# Patient Record
Sex: Female | Born: 1975 | Race: Black or African American | Hispanic: No | Marital: Single | Smoking: Current every day smoker
Health system: Southern US, Community
[De-identification: ages and names within clinical notes are randomized; demographics above are authoritative.]

## PROBLEM LIST (undated history)

## (undated) ENCOUNTER — Emergency Department (HOSPITAL_COMMUNITY): Admission: EM | Payer: Self-pay | Source: Home / Self Care

## (undated) DIAGNOSIS — N760 Acute vaginitis: Secondary | ICD-10-CM

## (undated) DIAGNOSIS — K219 Gastro-esophageal reflux disease without esophagitis: Secondary | ICD-10-CM

## (undated) DIAGNOSIS — B9689 Other specified bacterial agents as the cause of diseases classified elsewhere: Secondary | ICD-10-CM

## (undated) HISTORY — PX: DILATION AND CURETTAGE OF UTERUS: SHX78

## (undated) HISTORY — PX: TUBAL LIGATION: SHX77

---

## 1998-07-31 ENCOUNTER — Emergency Department (HOSPITAL_COMMUNITY): Admission: EM | Admit: 1998-07-31 | Discharge: 1998-07-31 | Payer: Self-pay | Admitting: Emergency Medicine

## 2000-03-07 ENCOUNTER — Other Ambulatory Visit: Admission: RE | Admit: 2000-03-07 | Discharge: 2000-03-07 | Payer: Self-pay | Admitting: Obstetrics

## 2000-09-06 ENCOUNTER — Inpatient Hospital Stay (HOSPITAL_COMMUNITY): Admission: AD | Admit: 2000-09-06 | Discharge: 2000-09-10 | Payer: Self-pay | Admitting: *Deleted

## 2000-09-06 ENCOUNTER — Encounter (INDEPENDENT_AMBULATORY_CARE_PROVIDER_SITE_OTHER): Payer: Self-pay | Admitting: Specialist

## 2000-09-06 ENCOUNTER — Encounter: Payer: Self-pay | Admitting: Obstetrics

## 2002-08-14 ENCOUNTER — Inpatient Hospital Stay (HOSPITAL_COMMUNITY): Admission: AD | Admit: 2002-08-14 | Discharge: 2002-08-17 | Payer: Self-pay | Admitting: Obstetrics

## 2002-08-14 ENCOUNTER — Encounter (INDEPENDENT_AMBULATORY_CARE_PROVIDER_SITE_OTHER): Payer: Self-pay | Admitting: Specialist

## 2003-10-28 ENCOUNTER — Ambulatory Visit (HOSPITAL_COMMUNITY): Admission: RE | Admit: 2003-10-28 | Discharge: 2003-10-28 | Payer: Self-pay | Admitting: Obstetrics

## 2003-11-20 ENCOUNTER — Ambulatory Visit (HOSPITAL_COMMUNITY): Admission: RE | Admit: 2003-11-20 | Discharge: 2003-11-20 | Payer: Self-pay | Admitting: Pulmonary Disease

## 2006-11-14 ENCOUNTER — Emergency Department (HOSPITAL_COMMUNITY): Admission: EM | Admit: 2006-11-14 | Discharge: 2006-11-14 | Payer: Self-pay | Admitting: Family Medicine

## 2007-05-09 ENCOUNTER — Emergency Department (HOSPITAL_COMMUNITY): Admission: EM | Admit: 2007-05-09 | Discharge: 2007-05-09 | Payer: Self-pay | Admitting: Emergency Medicine

## 2007-10-02 ENCOUNTER — Emergency Department (HOSPITAL_COMMUNITY): Admission: EM | Admit: 2007-10-02 | Discharge: 2007-10-02 | Payer: Self-pay | Admitting: Emergency Medicine

## 2008-10-29 ENCOUNTER — Emergency Department (HOSPITAL_COMMUNITY): Admission: EM | Admit: 2008-10-29 | Discharge: 2008-10-29 | Payer: Self-pay | Admitting: Family Medicine

## 2008-10-31 ENCOUNTER — Emergency Department (HOSPITAL_COMMUNITY): Admission: EM | Admit: 2008-10-31 | Discharge: 2008-10-31 | Payer: Self-pay | Admitting: Family Medicine

## 2009-03-27 ENCOUNTER — Emergency Department (HOSPITAL_COMMUNITY): Admission: EM | Admit: 2009-03-27 | Discharge: 2009-03-27 | Payer: Self-pay | Admitting: Family Medicine

## 2009-03-28 ENCOUNTER — Emergency Department (HOSPITAL_COMMUNITY): Admission: EM | Admit: 2009-03-28 | Discharge: 2009-03-28 | Payer: Self-pay | Admitting: Emergency Medicine

## 2009-06-28 ENCOUNTER — Emergency Department (HOSPITAL_COMMUNITY): Admission: EM | Admit: 2009-06-28 | Discharge: 2009-06-28 | Payer: Self-pay | Admitting: Family Medicine

## 2010-03-06 ENCOUNTER — Emergency Department (HOSPITAL_COMMUNITY): Admission: EM | Admit: 2010-03-06 | Discharge: 2010-03-06 | Payer: Self-pay | Admitting: Family Medicine

## 2010-11-15 LAB — URINE CULTURE

## 2010-11-15 LAB — POCT URINALYSIS DIP (DEVICE)
Bilirubin Urine: NEGATIVE
Glucose, UA: NEGATIVE mg/dL
Ketones, ur: NEGATIVE mg/dL
Nitrite: NEGATIVE
Protein, ur: NEGATIVE mg/dL
Specific Gravity, Urine: 1.02 (ref 1.005–1.030)
pH: 7 (ref 5.0–8.0)

## 2010-11-15 LAB — GC/CHLAMYDIA PROBE AMP, GENITAL: GC Probe Amp, Genital: NEGATIVE

## 2010-11-15 LAB — WET PREP, GENITAL
Clue Cells Wet Prep HPF POC: NONE SEEN
Yeast Wet Prep HPF POC: NONE SEEN

## 2010-12-06 LAB — POCT I-STAT, CHEM 8
BUN: 9 mg/dL (ref 6–23)
Creatinine, Ser: 0.8 mg/dL (ref 0.4–1.2)
Glucose, Bld: 90 mg/dL (ref 70–99)
HCT: 38 % (ref 36.0–46.0)
Potassium: 3.8 mEq/L (ref 3.5–5.1)
TCO2: 21 mmol/L (ref 0–100)

## 2010-12-06 LAB — URINALYSIS, ROUTINE W REFLEX MICROSCOPIC
Nitrite: NEGATIVE
Specific Gravity, Urine: 1.031 — ABNORMAL HIGH (ref 1.005–1.030)
Urobilinogen, UA: 0.2 mg/dL (ref 0.0–1.0)
pH: 6 (ref 5.0–8.0)

## 2010-12-06 LAB — URINE MICROSCOPIC-ADD ON

## 2011-01-01 ENCOUNTER — Inpatient Hospital Stay (INDEPENDENT_AMBULATORY_CARE_PROVIDER_SITE_OTHER)
Admission: RE | Admit: 2011-01-01 | Discharge: 2011-01-01 | Disposition: A | Discharge status: Home / Self Care | Payer: Self-pay | Source: Ambulatory Visit | Attending: Emergency Medicine | Admitting: Emergency Medicine

## 2011-01-01 DIAGNOSIS — M7989 Other specified soft tissue disorders: Secondary | ICD-10-CM

## 2011-01-01 LAB — POCT URINALYSIS DIP (DEVICE)
Glucose, UA: NEGATIVE mg/dL
Specific Gravity, Urine: 1.02 (ref 1.005–1.030)
Urobilinogen, UA: 0.2 mg/dL (ref 0.0–1.0)

## 2011-01-01 LAB — POCT PREGNANCY, URINE: Preg Test, Ur: NEGATIVE

## 2011-01-15 NOTE — H&P (Signed)
   NAME:  Terri Duran, CROSLIN                          ACCOUNT NO.:  [id number]   MEDICAL RECORD NO.:  [id number]                   PATIENT TYPE:  INP   LOCATION:  9199                                 FACILITY:  [facility]   PHYSICIAN:  Kathreen Cosier, M.D.           DATE OF BIRTH:  [birth year]   DATE OF ADMISSION:  08/14/2002  DATE OF DISCHARGE:                                HISTORY & PHYSICAL   HISTORY OF PRESENT ILLNESS:  The patient is a 35 year old gravida 3, para 1-  0-1-1 who had a previous cesarean section.  [facility] August 18, 2002.  The  patient desired a repeat cesarean section and tubal ligation.  She had a  positive GBS which was treated with ampicillin in the office.  There were no  allergies.   PHYSICAL EXAMINATION:  GENERAL:  Obese female not in labor.  HEENT:  Negative.  LUNGS:  Clear.  HEART:  Regular rhythm.  ABDOMEN:  Term.  PELVIC:  Cervix long, closed.  EXTREMITIES:  Negative.                                               Kathreen Cosier, M.D.    BAM/MEDQ  D:  08/14/2002  T:  08/14/2002  Job:  [id number]

## 2011-01-15 NOTE — H&P (Signed)
[facility]  Patient:    Terri Duran, Terri Duran                       MRN: [id number] Adm. Date:  [id number] Attending:  Venita Sheffield                         History and Physical  HISTORY OF PRESENT ILLNESS:   The patient is a 35 year old gravida 2 para 0-0-1-1, [facility] August 31, 2000 who was brought in for induction.  She had a negative group B strep.  The patient was massively obese and an ultrasound was performed for estimated fetal weight.  The estimated fetal weight was 2500 g. There was possible IUGR.  The AFI was 1.7.  Her cervix was long, closed, and the vertex was -3.  Cytotec was inserted when the patient was admitted and she started having regular contractions throughout the day.  By 5:25 p.m. she was 2 cm, 70%, with the vertex -3.  Amniotomy was performed and the fluid was meconium stained and there was a small amount of fluid.  IUPC was inserted and amnioinfusion was begun.  Patient was started on Pitocin stimulation and by 2 a.m. on January 9 cervix was unchanged and she started having late decelerations.  She was up to 2 milliunits of Pitocin.  It was decided she would be delivered by C section because of nonreassuring fetal heart rate tracing and late decelerations did not resolve with oxygen or position change.  PHYSICAL EXAMINATION:  GENERAL:                      Revealed a massively-obese female.  HEENT:                        Negative.  LUNGS:                        Clear.  HEART:                        Regular rhythm, no murmurs, no gallops.  ABDOMEN:                      Obese, term.  PELVIC:                       As described above.  EXTREMITIES:                  Negative. DD:  09/07/00 TD:  09/07/00 Job: [id number]

## 2011-01-15 NOTE — Op Note (Signed)
[facility] [place]  Patient:    Terri Duran, Terri Duran                       MRN: [id number] Proc. Date: 09/07/00 Adm. Date:  [id number] Attending:  Venita Sheffield                           Operative Report  PREOPERATIVE DIAGNOSES:       1. Intrauterine pregnancy at 41 weeks.                               2. Oligohydramnios.                               3. Meconium stained fluid.                               4. Nonreassuring fetal heart rate tracing.  SURGEON:                      Kathreen Cosier, M.D.  ANESTHESIA:                   Epidural.  DESCRIPTION OF PROCEDURE:     The patient was placed on the operating room table in the supine position and after the epidural was administered the abdomen was prepped and draped.  The bladder was emptied with a Foley catheter.  A transverse suprapubic incision was made and carried down to the rectus fascia.  The fascia was incised the length of the incision and the rectus muscles retracted laterally.  The peritoneum was incised longitudinally.  A transverse incision was made in the visceral peritoneum above the bladder and the bladder was mobilized inferiorly.  A transverse lower uterine incision was made and meconium stained fluid was noted.  The infant was OP and DeLee suctioned prior to delivery of the shoulders.  A team was in attendance.  The infant weighed 6 pounds 3 ounces and was assigned Apgar scores of 6 at one minute and 9 at five minutes.  The cord pH was 7.33. The placenta was removed manually and sent to pathology.  The uterine cavity was cleaned with dry laps and the uterine incision closed with continuous suture of #1 chromic including myometrium and endometrium, closed in one layer.  The visceral peritoneum was closed with one layer.  Lap and sponge counts were correct.  The uterus was contracted.  Tubes and ovaries were normal.  The abdomen was closed in layers, the peritoneum with  continuous suture of 0 chromic, the fascia with continuous suture of 0 Dexon, and the skin closed with a subcuticular stitch of 3-0 Monocryl.  Estimated blood loss was 500 cc. DD:  09/07/00 TD:  09/07/00 Job: [id number]

## 2011-01-15 NOTE — Discharge Summary (Signed)
   NAME:  Terri Duran, Terri Duran                          ACCOUNT NO.:  [id number]   MEDICAL RECORD NO.:  [id number]                   PATIENT TYPE:  INP   LOCATION:  9130                                 FACILITY:  [facility]   PHYSICIAN:  Kathreen Cosier, M.D.           DATE OF BIRTH:  [birth year]   DATE OF ADMISSION:  08/14/2002  DATE OF DISCHARGE:                                 DISCHARGE SUMMARY   HISTORY OF PRESENT ILLNESS:  The patient is a 35 year old Gravida III, Para  I, 0/0/1 with a previous C-section. Her EDC was August 18, 2002 and she  was admitted for a repeat C-section and tubal ligation at that time. She had  a female infant weighing 6 pounds and 13 ounces. Tubal ligation was also  performed. The patient did well postoperatively. Her hemoglobin was 10.4.   CONDITION ON DISCHARGE:  The patient was discharged to home on the third  postoperative day, ambulatory, on a regular diet.   FOLLOW UP:  She will see me in six  weeks.   DISCHARGE DIAGNOSES:  Status post elective repeat C-section at term and  tubal ligation for multiparity.                                               Kathreen Cosier, M.D.    BAM/MEDQ  D:  08/17/2002  T:  08/18/2002  Job:  [id number]

## 2011-01-15 NOTE — Op Note (Signed)
   NAME:  Terri Duran, Terri Duran                          ACCOUNT NO.:  [id number]   MEDICAL RECORD NO.:  [id number]                   PATIENT TYPE:  INP   LOCATION:  9199                                 FACILITY:  [facility]   PHYSICIAN:  Kathreen Cosier, M.D.           DATE OF BIRTH:  1976-02-16   DATE OF PROCEDURE:  08/14/2002  DATE OF DISCHARGE:                                 OPERATIVE REPORT   ANESTHESIA:  Spinal.   SURGEON:  Kathreen Cosier, M.D.   ASSISTANT:  Charles A. Clearance Coots, M.D.   DESCRIPTION OF PROCEDURE:  The patient was placed on the operating table in  a supine position, the abdomen prepped and draped.  The bladder emptied with  a Foley catheter.  A transverse suprapubic incision made through the old  scar, carried down to the rectus fascia, and fascia cleaned and incised the  length of the incision.  The recti muscles were retracted laterally, the  peritoneum incised longitudinally.  A transverse incision made in the  visceral peritoneum above the bladder.  The bladder mobilized inferiorly.  A  transverse lower uterine incision made.  The fluid was clear.  The patient  delivered from the OP position of a female, Apgar 9 and 9.  Delivery was  achieved using a vacuum.  The team was in attendance.  The placenta was  anterior and delivered manually.  The uterine cavity cleaned with dry laps.  Uterine incision closed in one layer with continuous suture of #1 chromic.  Bladder flap reattached with 2-0 chromic.  Uterus well-contracted.  Tubes  and ovaries normal.  The right tube grasped in the midportion with a Babcock  clamp, 0 plain suture placed on the mesosalpinx below the portion of the  tube that was clamped.  This was tied and approximately one inch of tube  transected.  The procedure done in the exact fashion on the other side.  Lap  and sponge counts correct.  Abdomen closed in layers, peritoneum continuous  suture of 0 chromic, fascia continuous suture of 0 Dexon, and  the skin  closed with subcuticular suture of 3-0 plain.  Blood loss 800 cc.  The  patient tolerated the procedure well, taken to the recovery room in good  condition.                                               Kathreen Cosier, M.D.    BAM/MEDQ  D:  08/14/2002  T:  08/14/2002  Job:  [id number]

## 2011-01-15 NOTE — Discharge Summary (Signed)
[facility] [place]  Patient:    Terri Duran, Terri Duran                       MRN: [id number] Adm. Date:  [id number] Attending:  Venita Sheffield                           Discharge Summary  HISTORY OF PRESENT ILLNESS:   Patient is a 35 year old, gravida 2, para 0-0-1-0, [facility] August 31, 2000. She was admitted for induction. Group B strep negative.  HOSPITAL COURSE:              Because of abdominal, a matter of obesity, ultrasound was done for estimated fetal weight and this came out to be 2500 g, possible IUGR. AFI was 1.7. Cervix was long, closed and patient received Cytotec. She went into labor and progressed to 2 cm, 70%. Amniotomy was performed, fluid was meconium stained and an amnioinfusion was begun. The patient had to undergo a C-section because of late decelerations. She had a 6-pound 3-ounce female. Cord pH 7.33. Postoperatively, she did well. She had a temperature elevation of 101 briefly, but repeat temperature shortly afterward, no treatment, was normal. She was discharged home on the third postoperative day, ambulatory and on a regular diet.  DISCHARGE MEDICATIONS:        Tylox one q.4h. p.r.n. and Cleocin 300 mg every six hours for five days.  DISCHARGE LABORATORIES:       Hemoglobin was 10.5.  DISCHARGE DIAGNOSIS:          Status post primary low transverse cesarean                               at 41 weeks because of nonreassuring fetal heart                               rate tracing. DD:  09/10/00 TD:  09/10/00 Job: [id number]

## 2011-05-21 LAB — TROPONIN I: Troponin I: 0.04

## 2011-05-21 LAB — CK TOTAL AND CKMB (NOT AT ARMC): Total CK: 195 — ABNORMAL HIGH

## 2011-06-11 LAB — URINALYSIS, ROUTINE W REFLEX MICROSCOPIC
Bilirubin Urine: NEGATIVE
Glucose, UA: NEGATIVE
Ketones, ur: NEGATIVE

## 2011-06-11 LAB — PREGNANCY, URINE: Preg Test, Ur: NEGATIVE

## 2011-06-11 LAB — OCCULT BLOOD X 1 CARD TO LAB, STOOL: Fecal Occult Bld: NEGATIVE

## 2012-02-21 ENCOUNTER — Encounter (HOSPITAL_COMMUNITY): Payer: Self-pay | Admitting: *Deleted

## 2012-02-21 ENCOUNTER — Emergency Department (HOSPITAL_COMMUNITY)
Admission: EM | Admit: 2012-02-21 | Discharge: 2012-02-21 | Discharge status: Against Medical Advice | Payer: Medicaid Other | Attending: Emergency Medicine | Admitting: Emergency Medicine

## 2012-02-21 DIAGNOSIS — Z0389 Encounter for observation for other suspected diseases and conditions ruled out: Secondary | ICD-10-CM | POA: Insufficient documentation

## 2012-02-21 LAB — URINALYSIS, ROUTINE W REFLEX MICROSCOPIC
Glucose, UA: NEGATIVE mg/dL
Ketones, ur: NEGATIVE mg/dL
Leukocytes, UA: NEGATIVE
Nitrite: NEGATIVE
Specific Gravity, Urine: 1.015 (ref 1.005–1.030)
pH: 6 (ref 5.0–8.0)

## 2012-02-21 LAB — URINE MICROSCOPIC-ADD ON

## 2012-02-21 NOTE — ED Notes (Signed)
Pt called x3. Unable to locate. 

## 2012-02-21 NOTE — ED Notes (Signed)
Patient reports frequent urination for 2 weeks.  She is also reporting vaginal discharge and lower back pain

## 2012-02-21 NOTE — ED Notes (Signed)
Pt called x1, unable to locate. 

## 2012-02-21 NOTE — ED Notes (Addendum)
Pt called x2, unable to locate 

## 2012-03-24 ENCOUNTER — Encounter (HOSPITAL_COMMUNITY): Payer: Self-pay | Admitting: *Deleted

## 2012-03-24 ENCOUNTER — Emergency Department (INDEPENDENT_AMBULATORY_CARE_PROVIDER_SITE_OTHER)
Admission: EM | Admit: 2012-03-24 | Discharge: 2012-03-24 | Disposition: A | Discharge status: Home / Self Care | Payer: Medicaid Other | Source: Home / Self Care

## 2012-03-24 DIAGNOSIS — N760 Acute vaginitis: Secondary | ICD-10-CM

## 2012-03-24 DIAGNOSIS — N898 Other specified noninflammatory disorders of vagina: Secondary | ICD-10-CM

## 2012-03-24 DIAGNOSIS — A499 Bacterial infection, unspecified: Secondary | ICD-10-CM

## 2012-03-24 HISTORY — DX: Acute vaginitis: B96.89

## 2012-03-24 HISTORY — DX: Gastro-esophageal reflux disease without esophagitis: K21.9

## 2012-03-24 HISTORY — DX: Other specified bacterial agents as the cause of diseases classified elsewhere: N76.0

## 2012-03-24 LAB — POCT URINALYSIS DIP (DEVICE)
Bilirubin Urine: NEGATIVE
Glucose, UA: NEGATIVE mg/dL
Hgb urine dipstick: NEGATIVE
Ketones, ur: NEGATIVE mg/dL
Nitrite: NEGATIVE
Specific Gravity, Urine: 1.02 (ref 1.005–1.030)
pH: 7 (ref 5.0–8.0)

## 2012-03-24 LAB — WET PREP, GENITAL
Trich, Wet Prep: NONE SEEN
Yeast Wet Prep HPF POC: NONE SEEN

## 2012-03-24 MED ORDER — METRONIDAZOLE 500 MG PO TABS: 500.0000 mg | ORAL_TABLET | Freq: Two times a day (BID) | ORAL | Status: AC

## 2012-03-24 NOTE — ED Notes (Signed)
C/O vaginal discharge x 12 days.  C/O dysuria, frequent urination, and urinary urgency x approx 2 wks.  Denies any fevers or vomiting or pain.  Also c/o recent frequent boils to pelvic region - currently has had one x 3-4 days.

## 2012-03-24 NOTE — ED Provider Notes (Signed)
History     CSN: [id number]  Arrival date & time 03/24/12  1833   None     Chief Complaint  Patient presents with  . Vaginal Discharge  . Urinary Tract Infection    (Consider location/radiation/quality/duration/timing/severity/associated sxs/prior treatment) Patient is a 36 y.o. female presenting with vaginal discharge and urinary tract infection. The history is provided by the patient.  Vaginal Discharge This is a new problem. The current episode started more than 2 days ago. The problem occurs constantly. The problem has not changed since onset.Associated symptoms include abdominal pain. Nothing aggravates the symptoms. Nothing relieves the symptoms. She has tried nothing for the symptoms.  Urinary Tract Infection Associated symptoms include abdominal pain.  Reports vaginal discharge for one week, clear with odor associated with abdominal cramping and dysuria.  Sexually active, married, no history of STD's or UTI. Past Medical History  Diagnosis Date  . BV (bacterial vaginosis)   . GERD (gastroesophageal reflux disease)     Past Surgical History  Procedure Date  . Tubal ligation   . Dilation and curettage of uterus     No family history on file.  History  Substance Use Topics  . Smoking status: Current Everyday Smoker -- 0.5 packs/day  . Smokeless tobacco: Not on file  . Alcohol Use: Yes    OB History    Grav Para Term Preterm Abortions TAB SAB Ect Mult Living                  Review of Systems  Constitutional: Negative for fever, chills, activity change and fatigue.  Gastrointestinal: Positive for abdominal pain. Negative for nausea, vomiting, diarrhea, constipation, blood in stool and abdominal distention.       Crampy  Genitourinary: Positive for dysuria, urgency, frequency, vaginal discharge and vaginal pain. Negative for hematuria, flank pain, vaginal bleeding, genital sores, pelvic pain and dyspareunia.  Skin: Negative for rash.    Allergies  Review  of patient's allergies indicates no known allergies.  Home Medications  No current outpatient prescriptions on file.  BP 120/80  Pulse 96  Temp 98.2 F (36.8 C) (Oral)  Resp 18  SpO2 98%  LMP 03/13/2012  Physical Exam  Nursing note and vitals reviewed. Constitutional: She is oriented to person, place, and time. Vital signs are normal. She appears well-developed and well-nourished. She is active and cooperative.  HENT:  Head: Normocephalic.  Eyes: Conjunctivae are normal. Pupils are equal, round, and reactive to light. No scleral icterus.  Neck: Trachea normal. Neck supple.  Cardiovascular: Normal rate and regular rhythm.   Pulmonary/Chest: Effort normal and breath sounds normal.  Abdominal: Normal appearance and bowel sounds are normal. She exhibits no distension and no mass. There is tenderness in the periumbilical area. There is no rebound and no CVA tenderness.  Genitourinary: Pelvic exam was performed with patient supine. There is no rash or lesion on the right labia. There is no rash or lesion on the left labia. Cervix exhibits no motion tenderness, no discharge and no friability. Right adnexum displays no mass and no tenderness. Left adnexum displays no mass and no tenderness. No erythema, tenderness or bleeding around the vagina. Vaginal discharge found.       Mostly homogenous discharge  Lymphadenopathy:       Right: No inguinal adenopathy present.       Left: No inguinal adenopathy present.  Neurological: She is alert and oriented to person, place, and time. No cranial nerve deficit or sensory deficit.  Skin: Skin  is warm and dry.  Psychiatric: She has a normal mood and affect. Her speech is normal and behavior is normal. Judgment and thought content normal. Cognition and memory are normal.    ED Course  Procedures (including critical care time)  Labs Reviewed  WET PREP, GENITAL - Abnormal; Notable for the following:    Clue Cells Wet Prep HPF POC FEW (*)     WBC, Wet  Prep HPF POC FEW (*)     All other components within normal limits  POCT URINALYSIS DIP (DEVICE)  POCT PREGNANCY, URINE  GC/CHLAMYDIA PROBE AMP, GENITAL   No results found.   1. Vaginal Discharge       MDM          Johnsie Kindred, NP 03/24/12 2044

## 2012-03-24 NOTE — ED Provider Notes (Signed)
Medical screening examination/treatment/procedure(s) were performed by non-physician practitioner and as supervising physician I was immediately available for consultation/collaboration.  Leslee Home, M.D.   Reuben Likes, MD 03/24/12 2130

## 2012-03-25 LAB — GC/CHLAMYDIA PROBE AMP, GENITAL: GC Probe Amp, Genital: NEGATIVE

## 2012-11-17 ENCOUNTER — Encounter (HOSPITAL_COMMUNITY): Payer: Self-pay | Admitting: *Deleted

## 2012-11-17 ENCOUNTER — Emergency Department (HOSPITAL_COMMUNITY)
Admission: EM | Admit: 2012-11-17 | Discharge: 2012-11-17 | Disposition: A | Discharge status: Home / Self Care | Payer: Medicaid Other | Attending: Emergency Medicine | Admitting: Emergency Medicine

## 2012-11-17 DIAGNOSIS — F17200 Nicotine dependence, unspecified, uncomplicated: Secondary | ICD-10-CM | POA: Insufficient documentation

## 2012-11-17 DIAGNOSIS — N760 Acute vaginitis: Secondary | ICD-10-CM | POA: Insufficient documentation

## 2012-11-17 DIAGNOSIS — Z3202 Encounter for pregnancy test, result negative: Secondary | ICD-10-CM | POA: Insufficient documentation

## 2012-11-17 DIAGNOSIS — B9689 Other specified bacterial agents as the cause of diseases classified elsewhere: Secondary | ICD-10-CM

## 2012-11-17 DIAGNOSIS — Z8719 Personal history of other diseases of the digestive system: Secondary | ICD-10-CM | POA: Insufficient documentation

## 2012-11-17 LAB — URINALYSIS, ROUTINE W REFLEX MICROSCOPIC
Bilirubin Urine: NEGATIVE
Hgb urine dipstick: NEGATIVE
Ketones, ur: NEGATIVE mg/dL
Specific Gravity, Urine: 1.029 (ref 1.005–1.030)
Urobilinogen, UA: 0.2 mg/dL (ref 0.0–1.0)

## 2012-11-17 LAB — PREGNANCY, URINE: Preg Test, Ur: NEGATIVE

## 2012-11-17 LAB — WET PREP, GENITAL: Trich, Wet Prep: NONE SEEN

## 2012-11-17 MED ORDER — METRONIDAZOLE 500 MG PO TABS: 500.0000 mg | ORAL_TABLET | Freq: Two times a day (BID) | ORAL | Status: DC

## 2012-11-17 MED ORDER — CEFTRIAXONE SODIUM 250 MG IJ SOLR
250.0000 mg | Freq: Once | INTRAMUSCULAR | Status: AC
  Administered 2012-11-17: 250 mg via INTRAMUSCULAR
  Filled 2012-11-17: qty 250

## 2012-11-17 MED ORDER — AZITHROMYCIN 250 MG PO TABS
1000.0000 mg | ORAL_TABLET | Freq: Once | ORAL | Status: AC
  Administered 2012-11-17: 1000 mg via ORAL
  Filled 2012-11-17: qty 4

## 2012-11-17 NOTE — Progress Notes (Signed)
During 11/17/12 Pt confirmed pcp as [facility] EPIC updated

## 2012-11-17 NOTE — ED Provider Notes (Signed)
History     CSN: [id number]  Arrival date & time 11/17/12  1048   First MD Initiated Contact with Patient 11/17/12 1051      Chief Complaint  Patient presents with  . Vaginal Discharge    (Consider location/radiation/quality/duration/timing/severity/associated sxs/prior treatment) HPI Comments: Pt presents to the ED for clear/white, non-odorous, vaginal discharge x 2 weeks.  Is also having some intermittent suprapubic pelvic pain. S/p tubal ligation. No new sexual partners.  Hx of BV, gonorrhea, and chlamydia in the past.  Denies any nausea, vomiting, fever, diarrhea, dysuria, hematuria, or diarrhea.  Patient is a 37 y.o. female presenting with vaginal discharge. The history is provided by the patient.  Vaginal Discharge    Past Medical History  Diagnosis Date  . BV (bacterial vaginosis)   . GERD (gastroesophageal reflux disease)     Past Surgical History  Procedure Laterality Date  . Tubal ligation    . Dilation and curettage of uterus      No family history on file.  History  Substance Use Topics  . Smoking status: Current Every Day Smoker -- 0.50 packs/day  . Smokeless tobacco: Not on file  . Alcohol Use: Yes    OB History   Grav Para Term Preterm Abortions TAB SAB Ect Mult Living                  Review of Systems  Genitourinary: Positive for vaginal discharge and pelvic pain.  All other systems reviewed and are negative.    Allergies  Review of patient's allergies indicates no known allergies.  Home Medications   Current Outpatient Rx  Name  Route  Sig  Dispense  Refill  . ibuprofen (ADVIL,MOTRIN) 200 MG tablet   Oral   Take 400-600 mg by mouth every 8 (eight) hours as needed for pain.           BP 158/86  Pulse 92  Resp 22  SpO2 100%  Physical Exam  Nursing note and vitals reviewed. Constitutional: She is oriented to person, place, and time. No distress.  Morbidly obese  HENT:  Head: Normocephalic and atraumatic.  Eyes:  Conjunctivae and EOM are normal.  Neck: Normal range of motion. Neck supple.  Cardiovascular: Normal rate, regular rhythm and normal heart sounds.   Pulmonary/Chest: Effort normal and breath sounds normal. She has no wheezes.  Abdominal: Soft. Bowel sounds are normal. There is tenderness in the suprapubic area. There is no guarding, no CVA tenderness, no tenderness at McBurney's point and negative Murphy's sign.  Genitourinary: Pelvic exam was performed with patient supine. There is no tenderness or lesion on the right labia. There is no tenderness or lesion on the left labia. Cervix exhibits no motion tenderness. Right adnexum displays no tenderness. Left adnexum displays no tenderness. Vaginal discharge (moderate purulent) found.  Lymphadenopathy:    She has no cervical adenopathy.  Neurological: She is alert and oriented to person, place, and time. She has normal strength. No cranial nerve deficit or sensory deficit.  Skin: Skin is warm and dry.  Psychiatric: She has a normal mood and affect. Her speech is normal.    ED Course  Procedures (including critical care time)  Labs Reviewed  WET PREP, GENITAL - Abnormal; Notable for the following:    Clue Cells Wet Prep HPF POC FEW (*)    WBC, Wet Prep HPF POC FEW (*)    All other components within normal limits  URINALYSIS, ROUTINE W REFLEX MICROSCOPIC - Abnormal; Notable for  the following:    APPearance HAZY (*)    All other components within normal limits  GC/CHLAMYDIA PROBE AMP  PREGNANCY, URINE   No results found.   1. Bacterial vaginosis       MDM   36 y.o. Female presents to the ED for clear/white, non-odorous, vaginal discharge x 2 weeks.  Is also having some intermittent suprapubic pelvic pain.  No new sexual partners.  S/p tubal ligation. No new sexual partners.  Hx of BV, gonorrhea, and chlamydia in the past.  Pelvic exam revealed moderate amount of purulent vaginal discharge.  Few clue cells present on wet prep.  Tx ppx  with rocephin and azithromycin.  Rx given for flagyl x7d.  Advised not to drink EtOH while taking this medication.  FU with PCP if sx not improving.  Will be notified in 48-72 hrs if results of GC/CHL abnormal.  Return precautions advised.        Garlon Hatchet, PA-C 11/17/12 1907

## 2012-11-17 NOTE — ED Notes (Signed)
Pt c/o vaginal discharge x 2 weeks, pt also reports vaginal pain occasionally.

## 2012-11-18 LAB — GC/CHLAMYDIA PROBE AMP
CT Probe RNA: NEGATIVE
GC Probe RNA: NEGATIVE

## 2012-11-18 NOTE — ED Provider Notes (Signed)
Medical screening examination/treatment/procedure(s) were performed by non-physician practitioner and as supervising physician I was immediately available for consultation/collaboration.   Glynn Octave, MD 11/18/12 1310

## 2013-09-04 ENCOUNTER — Encounter: Payer: Self-pay | Admitting: Advanced Practice Midwife

## 2013-09-07 ENCOUNTER — Ambulatory Visit: Payer: Medicaid Other | Admitting: Advanced Practice Midwife

## 2013-11-21 ENCOUNTER — Encounter: Payer: Self-pay | Admitting: Advanced Practice Midwife

## 2013-11-21 ENCOUNTER — Ambulatory Visit (INDEPENDENT_AMBULATORY_CARE_PROVIDER_SITE_OTHER): Payer: Medicaid Other | Admitting: Advanced Practice Midwife

## 2013-11-21 VITALS — BP 126/84 | HR 76 | Temp 97.5°F | Ht 65.0 in | Wt 286.0 lb

## 2013-11-21 DIAGNOSIS — Z1329 Encounter for screening for other suspected endocrine disorder: Secondary | ICD-10-CM

## 2013-11-21 DIAGNOSIS — R1032 Left lower quadrant pain: Secondary | ICD-10-CM

## 2013-11-21 DIAGNOSIS — Z130 Encounter for screening for diseases of the blood and blood-forming organs and certain disorders involving the immune mechanism: Secondary | ICD-10-CM

## 2013-11-21 DIAGNOSIS — IMO0002 Reserved for concepts with insufficient information to code with codable children: Secondary | ICD-10-CM

## 2013-11-21 DIAGNOSIS — Z0000 Encounter for general adult medical examination without abnormal findings: Secondary | ICD-10-CM

## 2013-11-21 DIAGNOSIS — F17200 Nicotine dependence, unspecified, uncomplicated: Secondary | ICD-10-CM | POA: Insufficient documentation

## 2013-11-21 DIAGNOSIS — Z833 Family history of diabetes mellitus: Secondary | ICD-10-CM

## 2013-11-21 DIAGNOSIS — Z131 Encounter for screening for diabetes mellitus: Secondary | ICD-10-CM

## 2013-11-21 DIAGNOSIS — R1031 Right lower quadrant pain: Secondary | ICD-10-CM | POA: Insufficient documentation

## 2013-11-21 DIAGNOSIS — N939 Abnormal uterine and vaginal bleeding, unspecified: Secondary | ICD-10-CM | POA: Insufficient documentation

## 2013-11-21 LAB — COMPREHENSIVE METABOLIC PANEL
ALBUMIN: 3.8 g/dL (ref 3.5–5.2)
ALK PHOS: 54 U/L (ref 39–117)
ALT: 16 U/L (ref 0–35)
AST: 18 U/L (ref 0–37)
BUN: 12 mg/dL (ref 6–23)
CO2: 26 mEq/L (ref 19–32)
Calcium: 9.1 mg/dL (ref 8.4–10.5)
Chloride: 104 mEq/L (ref 96–112)
Creat: 0.78 mg/dL (ref 0.50–1.10)
GLUCOSE: 73 mg/dL (ref 70–99)
POTASSIUM: 3.7 meq/L (ref 3.5–5.3)
SODIUM: 139 meq/L (ref 135–145)
TOTAL PROTEIN: 6.9 g/dL (ref 6.0–8.3)
Total Bilirubin: 0.3 mg/dL (ref 0.2–1.2)

## 2013-11-21 LAB — CBC
HCT: 40.1 % (ref 36.0–46.0)
Hemoglobin: 13.2 g/dL (ref 12.0–15.0)
MCH: 28.3 pg (ref 26.0–34.0)
MCHC: 32.9 g/dL (ref 30.0–36.0)
MCV: 85.9 fL (ref 78.0–100.0)
PLATELETS: 217 10*3/uL (ref 150–400)
RBC: 4.67 MIL/uL (ref 3.87–5.11)
RDW: 14.6 % (ref 11.5–15.5)
WBC: 7.1 10*3/uL (ref 4.0–10.5)

## 2013-11-21 LAB — HEMOGLOBIN A1C
HEMOGLOBIN A1C: 5.8 % — AB (ref <5.7)
Mean Plasma Glucose: 120 mg/dL — ABNORMAL HIGH (ref <117)

## 2013-11-21 NOTE — Progress Notes (Signed)
Subjective:     Terri Duran is a 38 y.o. female here for a routine exam.  Current complaints: Patient is in the office today for an annual exam. Patient states her cycle started on the 1st and lasted until 7th. Patient states she started cramping yesterday and her cycle started today. Patient claims cycle is very light. Patient would like to have her glucose tested or would like referral.  Personal health questionnaire reviewed: yes.   Terri Duran reports this is the first time she has had irregular bleeding. States she has new onset of lower pelvic discomfort that is not consistent, just occasionally hurts. Denies pain w/ intercourse, denies a pattern to the pain or when it occurs, states it is on her side, is mild but she has a hx of an ovarian cyst that was removed and she is concerned it returned.   States she is worried something is wrong with her hormones because she started her period early this month.   Gynecologic History Patient's last menstrual period was 10/28/2013. Contraception: tubal ligation Last Pap: Unknown. Results were: normal   Obstetric History OB History  Gravida Para Term Preterm AB SAB TAB Ectopic Multiple Living  3 2 2  1 1    2     # Outcome Date GA Lbr Len/2nd Weight Sex Delivery Anes PTL Lv  3 TRM 08/14/02 [id number]  6 lb 5 oz (2.863 kg) F LTCS EPI  Y  2 TRM 09/07/00 [redacted]w[redacted]d  6 lb 7 oz (2.92 kg) M LTCS EPI  Y  1 SAB 1998        N       The following portions of the patient's history were reviewed and updated as appropriate: allergies, current medications, past family history, past medical history, past social history, past surgical history and problem list.  Review of Systems Pertinent items are noted in HPI.    Objective:    BP 126/84  Pulse 76  Temp(Src) 97.5 F (36.4 C)  Ht 5\' 5"  (1.651 m)  Wt 286 lb (129.729 kg)  BMI 47.59 kg/m2  LMP 10/28/2013 General appearance: alert and cooperative Head: Normocephalic, without obvious abnormality,  atraumatic Eyes: conjunctivae/corneas clear. PERRL, EOM's intact. Fundi benign. Ears: normal TM's and external ear canals both ears Nose: Nares normal. Septum midline. Mucosa normal. No drainage or sinus tenderness. Throat: lips, mucosa, and tongue normal; teeth and gums normal Neck: no adenopathy, no carotid bruit, no JVD, supple, symmetrical, trachea midline and thyroid not enlarged, symmetric, no tenderness/mass/nodules Back: symmetric, no curvature. ROM normal. No CVA tenderness. Lungs: clear to auscultation bilaterally Breasts: normal appearance, no masses or tenderness Heart: regular rate and rhythm, S1, S2 normal, no murmur, click, rub or gallop Abdomen: soft, non-tender; bowel sounds normal; no masses,  no organomegaly Pelvic: cervix normal in appearance, external genitalia normal, no adnexal masses or tenderness, no cervical motion tenderness, rectovaginal septum normal, uterus normal size, shape, and consistency and vagina normal without discharge Extremities: extremities normal, atraumatic, no cyanosis or edema Pulses: 2+ and symmetric Skin: Skin color, texture, turgor normal. No rashes or lesions Lymph nodes: Cervical, supraclavicular, and axillary nodes normal. Neurologic: Grossly normal    Patient had heavy bleeding on exam today. Will do pap at patient's f/u appt.  Assessment:   Patient Active Problem List   Diagnosis Date Noted  . Family history of diabetes mellitus (DM) 11/21/2013  . High BMI 11/21/2013  . Abnormal uterine bleeding 11/21/2013  . Abdominal pain, bilateral lower quadrant 11/21/2013  Smoker  Plan:    Education reviewed: calcium supplements, low fat, low cholesterol diet, safe sex/STD prevention, smoking cessation, weight bearing exercise and reviewed importance of weight loss. Contraception: tubal ligation. Follow up in: 3 months.   Patient will monitor MP for the next few months. If she continues to have heavy periods or AUB we can offer her  options for prevention and improvement of symptoms at that time. If patients pain worsens, continues and/or does not go away consider pelvic US. Plan pap at f/u appt, unable to today because of heavy bleeding w/ MP.  Wellness labs today, patient to consider fasting cholesterol. Encouraged smoking cessation, offer additional resources NV.   45 min spent with patient greater than 80% spent in counseling and coordination of care.   Terri Duran Roni Bread CNM

## 2013-11-22 LAB — TSH: TSH: 1.604 u[IU]/mL (ref 0.350–4.500)

## 2013-11-23 ENCOUNTER — Ambulatory Visit: Payer: Medicaid Other | Admitting: Advanced Practice Midwife

## 2014-02-22 ENCOUNTER — Ambulatory Visit: Payer: Medicaid Other | Admitting: Advanced Practice Midwife

## 2014-02-26 ENCOUNTER — Encounter: Payer: Self-pay | Admitting: Advanced Practice Midwife

## 2014-02-26 ENCOUNTER — Ambulatory Visit (INDEPENDENT_AMBULATORY_CARE_PROVIDER_SITE_OTHER): Payer: Medicaid Other | Admitting: Advanced Practice Midwife

## 2014-02-26 VITALS — BP 115/79 | HR 69 | Temp 98.6°F | Ht 65.0 in

## 2014-02-26 DIAGNOSIS — N939 Abnormal uterine and vaginal bleeding, unspecified: Secondary | ICD-10-CM

## 2014-02-26 DIAGNOSIS — Z01419 Encounter for gynecological examination (general) (routine) without abnormal findings: Secondary | ICD-10-CM

## 2014-02-26 DIAGNOSIS — N926 Irregular menstruation, unspecified: Secondary | ICD-10-CM

## 2014-02-26 NOTE — Progress Notes (Signed)
Subjective:     Patient ID: Terri Duran, female   DOB: 10/09/1975, 38 y.o.   MRN: [id number]  Gynecologic Exam The patient's primary symptoms include vaginal bleeding. This is a recurrent problem. The current episode started more than 1 month ago. The patient is experiencing no pain. She is not pregnant. Pertinent negatives include no abdominal pain or fever. The vaginal discharge was normal. The vaginal bleeding is typical of menses. She has not been passing clots. She has not been passing tissue. Nothing aggravates the symptoms. Treatments tried: weight loss. She is sexually active. She uses tubal ligation for contraception. Her menstrual history has been regular.   Patient concerned the length of her period is longer some months than others. States she is still having a regular monthly period. Reports some discomfort during her cycle but denies pelvic pain at this time.  Patient smokes 1/2 PPD.  Denies need for STI screen.   Does not desire IUD at this time to lighten MP.   Review of Systems  Constitutional: Negative.  Negative for fever.  HENT: Negative.   Eyes: Negative.   Respiratory: Negative.   Cardiovascular: Negative.   Gastrointestinal: Negative.  Negative for abdominal pain.  Endocrine: Negative.   Genitourinary: Negative.   Musculoskeletal: Negative.   Allergic/Immunologic: Negative.   Neurological: Negative.   Hematological: Negative.   Psychiatric/Behavioral: Negative.        Objective:   Physical Exam  Vitals reviewed. Constitutional: She is oriented to person, place, and time. She appears well-developed and well-nourished.  HENT:  Head: Normocephalic.  Eyes: Pupils are equal, round, and reactive to light.  Neck: Normal range of motion. Neck supple.  Cardiovascular: Normal rate and regular rhythm.   Pulmonary/Chest: Effort normal and breath sounds normal.  Abdominal: Soft.  Genitourinary: Vagina normal and uterus normal.  Musculoskeletal: Normal range of  motion.  Neurological: She is alert and oriented to person, place, and time.  Skin: Skin is warm and dry.  Psychiatric: She has a normal mood and affect. Her behavior is normal. Judgment and thought content normal.   Filed Vitals:   02/26/14 1112  BP: 115/79  Pulse: 69  Temp: 98.6 F (37 C)        Assessment:     Patient not an ideal candidate for OCP use for regulation of MP due to age and smoking status.  Patient Active Problem List   Diagnosis Date Noted  . Family history of diabetes mellitus (DM) 11/21/2013  . High BMI 11/21/2013  . Smoker 11/21/2013         Plan:     Patient aware of past lab values, encouraged patient see PCP and cont to monitor HgbA1C and blood glucose. Encouraged weight loss and diet. Encouraged smoking cessation.  Pap Smear w/ HPV today.  [name] Roni Bread CNM

## 2014-02-27 LAB — PAP IG AND HPV HIGH-RISK: HPV DNA HIGH RISK: NOT DETECTED

## 2014-07-01 ENCOUNTER — Encounter: Payer: Self-pay | Admitting: Advanced Practice Midwife

## 2014-12-07 ENCOUNTER — Emergency Department (HOSPITAL_COMMUNITY)
Admission: EM | Admit: 2014-12-07 | Discharge: 2014-12-07 | Disposition: A | Discharge status: Home / Self Care | Payer: No Typology Code available for payment source | Attending: Emergency Medicine | Admitting: Emergency Medicine

## 2014-12-07 ENCOUNTER — Encounter (HOSPITAL_COMMUNITY): Payer: Self-pay | Admitting: Emergency Medicine

## 2014-12-07 DIAGNOSIS — Z8719 Personal history of other diseases of the digestive system: Secondary | ICD-10-CM | POA: Insufficient documentation

## 2014-12-07 DIAGNOSIS — Y998 Other external cause status: Secondary | ICD-10-CM | POA: Diagnosis not present

## 2014-12-07 DIAGNOSIS — S29012A Strain of muscle and tendon of back wall of thorax, initial encounter: Secondary | ICD-10-CM | POA: Diagnosis not present

## 2014-12-07 DIAGNOSIS — Y9389 Activity, other specified: Secondary | ICD-10-CM | POA: Diagnosis not present

## 2014-12-07 DIAGNOSIS — T148XXA Other injury of unspecified body region, initial encounter: Secondary | ICD-10-CM

## 2014-12-07 DIAGNOSIS — Y92410 Unspecified street and highway as the place of occurrence of the external cause: Secondary | ICD-10-CM | POA: Diagnosis not present

## 2014-12-07 DIAGNOSIS — Z8742 Personal history of other diseases of the female genital tract: Secondary | ICD-10-CM | POA: Insufficient documentation

## 2014-12-07 DIAGNOSIS — Z720 Tobacco use: Secondary | ICD-10-CM | POA: Diagnosis not present

## 2014-12-07 DIAGNOSIS — S3992XA Unspecified injury of lower back, initial encounter: Secondary | ICD-10-CM | POA: Diagnosis present

## 2014-12-07 MED ORDER — CYCLOBENZAPRINE HCL 10 MG PO TABS: 10.0000 mg | ORAL_TABLET | Freq: Two times a day (BID) | ORAL | Status: DC | PRN

## 2014-12-07 MED ORDER — IBUPROFEN 800 MG PO TABS: 800.0000 mg | ORAL_TABLET | Freq: Three times a day (TID) | ORAL | Status: DC
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
## 2015-02-19 ENCOUNTER — Encounter: Payer: Self-pay | Admitting: Certified Nurse Midwife

## 2015-02-19 ENCOUNTER — Ambulatory Visit (INDEPENDENT_AMBULATORY_CARE_PROVIDER_SITE_OTHER): Payer: Medicaid Other | Admitting: Certified Nurse Midwife

## 2015-02-19 VITALS — BP 121/76 | HR 73 | Temp 97.9°F | Ht 65.0 in | Wt 288.0 lb

## 2015-02-19 DIAGNOSIS — Z0000 Encounter for general adult medical examination without abnormal findings: Secondary | ICD-10-CM

## 2015-02-19 NOTE — Progress Notes (Signed)
Patient ID: Terri Duran, female   DOB: [birth year], 39 y.o.   MRN: [id number]  Patient did not stay.  She was a week early for her annual pap smear.  Patient rescheduled for another day.

## 2015-03-05 ENCOUNTER — Ambulatory Visit (INDEPENDENT_AMBULATORY_CARE_PROVIDER_SITE_OTHER): Payer: Medicaid Other | Admitting: Certified Nurse Midwife

## 2015-03-05 ENCOUNTER — Encounter: Payer: Self-pay | Admitting: Certified Nurse Midwife

## 2015-03-05 ENCOUNTER — Other Ambulatory Visit: Payer: Self-pay | Admitting: Certified Nurse Midwife

## 2015-03-05 VITALS — BP 125/78 | HR 86 | Temp 97.6°F | Ht 65.0 in | Wt 284.0 lb

## 2015-03-05 DIAGNOSIS — B373 Candidiasis of vulva and vagina: Secondary | ICD-10-CM

## 2015-03-05 DIAGNOSIS — N760 Acute vaginitis: Secondary | ICD-10-CM | POA: Diagnosis not present

## 2015-03-05 DIAGNOSIS — Z0000 Encounter for general adult medical examination without abnormal findings: Secondary | ICD-10-CM

## 2015-03-05 DIAGNOSIS — Z720 Tobacco use: Secondary | ICD-10-CM | POA: Diagnosis not present

## 2015-03-05 DIAGNOSIS — N63 Unspecified lump in unspecified breast: Secondary | ICD-10-CM

## 2015-03-05 DIAGNOSIS — R7303 Prediabetes: Secondary | ICD-10-CM

## 2015-03-05 DIAGNOSIS — A499 Bacterial infection, unspecified: Secondary | ICD-10-CM | POA: Diagnosis not present

## 2015-03-05 DIAGNOSIS — B3731 Acute candidiasis of vulva and vagina: Secondary | ICD-10-CM

## 2015-03-05 DIAGNOSIS — E669 Obesity, unspecified: Secondary | ICD-10-CM | POA: Diagnosis not present

## 2015-03-05 DIAGNOSIS — B9689 Other specified bacterial agents as the cause of diseases classified elsewhere: Secondary | ICD-10-CM

## 2015-03-05 DIAGNOSIS — Z716 Tobacco abuse counseling: Secondary | ICD-10-CM

## 2015-03-05 DIAGNOSIS — R7309 Other abnormal glucose: Secondary | ICD-10-CM | POA: Diagnosis not present

## 2015-03-05 LAB — CBC WITH DIFFERENTIAL/PLATELET
BASOS ABS: 0 10*3/uL (ref 0.0–0.1)
Basophils Relative: 0 % (ref 0–1)
Eosinophils Absolute: 0.2 10*3/uL (ref 0.0–0.7)
Eosinophils Relative: 2 % (ref 0–5)
HEMATOCRIT: 40.1 % (ref 36.0–46.0)
Hemoglobin: 12.7 g/dL (ref 12.0–15.0)
LYMPHS PCT: 43 % (ref 12–46)
Lymphs Abs: 3.7 10*3/uL (ref 0.7–4.0)
MCH: 27.5 pg (ref 26.0–34.0)
MCHC: 31.7 g/dL (ref 30.0–36.0)
MCV: 87 fL (ref 78.0–100.0)
MONO ABS: 0.6 10*3/uL (ref 0.1–1.0)
MPV: 11.3 fL (ref 8.6–12.4)
Monocytes Relative: 7 % (ref 3–12)
NEUTROS ABS: 4.1 10*3/uL (ref 1.7–7.7)
Neutrophils Relative %: 48 % (ref 43–77)
PLATELETS: 249 10*3/uL (ref 150–400)
RBC: 4.61 MIL/uL (ref 3.87–5.11)
RDW: 15 % (ref 11.5–15.5)
WBC: 8.5 10*3/uL (ref 4.0–10.5)

## 2015-03-05 LAB — COMPREHENSIVE METABOLIC PANEL
ALK PHOS: 49 U/L (ref 39–117)
ALT: 14 U/L (ref 0–35)
AST: 15 U/L (ref 0–37)
Albumin: 3.7 g/dL (ref 3.5–5.2)
BILIRUBIN TOTAL: 0.2 mg/dL (ref 0.2–1.2)
BUN: 14 mg/dL (ref 6–23)
CO2: 25 mEq/L (ref 19–32)
Calcium: 9.2 mg/dL (ref 8.4–10.5)
Chloride: 106 mEq/L (ref 96–112)
Creat: 0.84 mg/dL (ref 0.50–1.10)
GLUCOSE: 91 mg/dL (ref 70–99)
POTASSIUM: 4.2 meq/L (ref 3.5–5.3)
SODIUM: 141 meq/L (ref 135–145)
TOTAL PROTEIN: 6.8 g/dL (ref 6.0–8.3)

## 2015-03-05 LAB — HEMOGLOBIN A1C
Hgb A1c MFr Bld: 5.7 % — ABNORMAL HIGH (ref <5.7)
Mean Plasma Glucose: 117 mg/dL — ABNORMAL HIGH (ref <117)

## 2015-03-05 LAB — CHOLESTEROL, TOTAL: Cholesterol: 154 mg/dL (ref 0–200)

## 2015-03-05 LAB — HDL CHOLESTEROL: HDL: 62 mg/dL (ref >=46)

## 2015-03-05 LAB — TRIGLYCERIDES: Triglycerides: 64 mg/dL (ref <150)

## 2015-03-05 LAB — TSH: TSH: 1.007 u[IU]/mL (ref 0.350–4.500)

## 2015-03-05 MED ORDER — VARENICLINE TARTRATE 0.5 MG X 11 & 1 MG X 42 PO MISC: ORAL | Status: DC

## 2015-03-05 MED ORDER — TINIDAZOLE 500 MG PO TABS: 2.0000 g | ORAL_TABLET | Freq: Every day | ORAL | Status: AC

## 2015-03-05 MED ORDER — FLUCONAZOLE 100 MG PO TABS: 100.0000 mg | ORAL_TABLET | Freq: Once | ORAL | Status: DC

## 2015-03-05 NOTE — Patient Instructions (Signed)
Varenicline oral tablets What is this medicine? VARENICLINE (var EN i kleen) is used to help people quit smoking. It can reduce the symptoms caused by stopping smoking. It is used with a patient support program recommended by your physician. This medicine may be used for other purposes; ask your health care provider or pharmacist if you have questions. COMMON BRAND NAME(S): Chantix What should I tell my health care provider before I take this medicine? They need to know if you have any of these conditions: -bipolar disorder, depression, schizophrenia or other mental illness -heart disease -if you often drink alcohol -kidney disease -peripheral vascular disease -seizures -stroke -suicidal thoughts, plans, or attempt; a previous suicide attempt by you or a family member -an unusual or allergic reaction to varenicline, other medicines, foods, dyes, or preservatives -pregnant or trying to get pregnant -breast-feeding How should I use this medicine? You should set a date to stop smoking and tell your doctor. Start this medicine one week before the quit date. You can also start taking this medicine before you choose a quit date, and then pick a quit date that is between 8 and 35 days of treatment with this medicine. Stick to your plan; ask about support groups or other ways to help you remain a 'quitter'. Take this medicine by mouth after eating. Take with a full glass of water. Follow the directions on the prescription label. Take your doses at regular intervals. Do not take your medicine more often than directed. A special MedGuide will be given to you by the pharmacist with each prescription and refill. Be sure to read this information carefully each time. Talk to your pediatrician regarding the use of this medicine in children. This medicine is not approved for use in children. Overdosage: If you think you have taken too much of this medicine contact a poison control center or emergency room at  once. NOTE: This medicine is only for you. Do not share this medicine with others. What if I miss a dose? If you miss a dose, take it as soon as you can. If it is almost time for your next dose, take only that dose. Do not take double or extra doses. What may interact with this medicine? -alcohol or any product that contains alcohol -insulin -other stop smoking aids -theophylline -warfarin This list may not describe all possible interactions. Give your health care provider a list of all the medicines, herbs, non-prescription drugs, or dietary supplements you use. Also tell them if you smoke, drink alcohol, or use illegal drugs. Some items may interact with your medicine. What should I watch for while using this medicine? Visit your doctor or health care professional for regular check ups. Ask for ongoing advice and encouragement from your doctor or healthcare professional, friends, and family to help you quit. If you smoke while on this medication, quit again Your mouth may get dry. Chewing sugarless gum or sucking hard candy, and drinking plenty of water may help. Contact your doctor if the problem does not go away or is severe. You may get drowsy or dizzy. Do not drive, use machinery, or do anything that needs mental alertness until you know how this medicine affects you. Do not stand or sit up quickly, especially if you are an older patient. This reduces the risk of dizzy or fainting spells. The use of this medicine may increase the chance of suicidal thoughts or actions. Pay special attention to how you are responding while on this medicine. Any worsening of   mood, or thoughts of suicide or dying should be reported to your health care professional right away. What side effects may I notice from receiving this medicine? Side effects that you should report to your doctor or health care professional as soon as possible: -allergic reactions like skin rash, itching or hives, swelling of the face,  lips, tongue, or throat -breathing problems -changes in vision -chest pain or chest tightness -confusion, trouble speaking or understanding -fast, irregular heartbeat -feeling faint or lightheaded, falls -fever -pain in legs when walking -problems with balance, talking, walking -redness, blistering, peeling or loosening of the skin, including inside the mouth -ringing in ears -seizures -sudden numbness or weakness of the face, arm or leg -suicidal thoughts or other mood changes -trouble passing urine or change in the amount of urine -unusual bleeding or bruising -unusually weak or tired Side effects that usually do not require medical attention (report to your doctor or health care professional if they continue or are bothersome): -constipation -headache -nausea, vomiting -strange dreams -stomach gas -trouble sleeping This list may not describe all possible side effects. Call your doctor for medical advice about side effects. You may report side effects to FDA at 1-800-FDA-1088. Where should I keep my medicine? Keep out of the reach of children. Store at room temperature between 15 and 30 degrees C (59 and 86 degrees F). Throw away any unused medicine after the expiration date. NOTE: This sheet is a summary. It may not cover all possible information. If you have questions about this medicine, talk to your doctor, pharmacist, or health care provider.  2015, Elsevier/Gold Standard. (2013-05-28 13:37:47)  

## 2015-03-05 NOTE — Progress Notes (Signed)
Patient ID: Brand Males, female   DOB: 04-Oct-1975, 39 y.o.   MRN: [id number]    Subjective:      Terri Duran is a 39 y.o. female here for a routine exam.  Current complaints: missed period in march, had period in April with lots of clots, returned end of April then had another period in the middle of april.  Denies any change in diet/weight, has had change in stress level.  Currently sexually active with one partner.  Not on birth control, had a BTL.  Smokes: 1pack/day, has been smoking for 17+ years.   Patient states that she is going to increase her exercise levels.  Struggling with depression since loss of job.        Personal health questionnaire:  Is patient [name], have a family history of breast and/or ovarian cancer: yes, maternal aunt dx around her mid 24's.  Is there a family history of uterine cancer diagnosed at age < 82, gastrointestinal cancer, urinary tract cancer, family member who is a Field seismologist syndrome-associated carrier: no Is the patient overweight and hypertensive, family history of diabetes, personal history of gestational diabetes, preeclampsia or PCOS: yes Is patient over 44, have PCOS,  family history of premature CHD under age 38, diabetes, smoke, have hypertension or peripheral artery disease:  Yes, Mother, GM, sister Aunts: DM.   At any time, has a partner hit, kicked or otherwise hurt or frightened you?: no Over the past 2 weeks, have you felt down, depressed or hopeless?: yes Over the past 2 weeks, have you felt little interest or pleasure in doing things?:sometimes   Gynecologic History Patient's last menstrual period was 02/12/2015 (approximate). Contraception: tubal ligation Last Pap: 02/26/14. Results were: normal Last mammogram: N/A.                   Obstetric History OB History  Gravida Para Term Preterm AB SAB TAB Ectopic Multiple Living  3 2 2  1 1    2     # Outcome Date GA Lbr Len/2nd Weight Sex Delivery Anes PTL Lv  3 Term 08/14/02  [redacted]w[redacted]d  6 lb 5 oz (2.863 kg) F CS-LTranv EPI  Y  2 Term 09/07/00 [redacted]w[redacted]d  6 lb 7 oz (2.92 kg) M CS-LTranv EPI  Y  1 SAB 1998        N      Past Medical History  Diagnosis Date  . BV (bacterial vaginosis)   . GERD (gastroesophageal reflux disease)     Past Surgical History  Procedure Laterality Date  . Tubal ligation    . Dilation and curettage of uterus       Current outpatient prescriptions:  .  cyclobenzaprine (FLEXERIL) 10 MG tablet, Take 1 tablet (10 mg total) by mouth 2 (two) times daily as needed for muscle spasms. (Patient not taking: Reported on 02/19/2015), Disp: 20 tablet, Rfl: 0 .  fluconazole (DIFLUCAN) 100 MG tablet, Take 1 tablet (100 mg total) by mouth once. Repeat dose in 48-72 hour., Disp: 3 tablet, Rfl: 0 .  ibuprofen (ADVIL,MOTRIN) 800 MG tablet, Take 1 tablet (800 mg total) by mouth 3 (three) times daily. (Patient not taking: Reported on 02/19/2015), Disp: 21 tablet, Rfl: 0 .  tinidazole (TINDAMAX) 500 MG tablet, Take 4 tablets (2,000 mg total) by mouth daily with breakfast., Disp: 12 tablet, Rfl: 0 .  varenicline (CHANTIX STARTING MONTH PAK) 0.5 MG X 11 & 1 MG X 42 tablet, Take a 0.5mg  tablet 1X/day for  3 days, increase to twice a day for 4 days, then take 1 mg tablet twice a day., Disp: 53 tablet, Rfl: 0 No Known Allergies  History  Substance Use Topics  . Smoking status: Current Every Day Smoker -- 0.50 packs/day  . Smokeless tobacco: Never Used  . Alcohol Use: No    Family History  Problem Relation Age of Onset  . Diabetes Mother   . Hypertension Mother   . Hyperlipidemia Mother       Review of Systems  Constitutional: negative for fatigue and weight loss Respiratory: negative for cough and wheezing Cardiovascular: negative for chest pain, fatigue and palpitations Gastrointestinal: negative for abdominal pain and change in bowel habits Musculoskeletal:negative for myalgias Neurological: negative for gait problems and tremors Behavioral/Psych: negative  for abusive relationship, depression Endocrine: negative for temperature intolerance   Genitourinary:negative for abnormal menstrual periods, genital lesions, hot flashes, sexual problems and vaginal discharge Integument/breast: negative for breast lump, breast tenderness, nipple discharge and skin lesion(s)    Objective:       BP 125/78 mmHg  Pulse 86  Temp(Src) 97.6 F (36.4 C)  Ht 5\' 5"  (1.651 m)  Wt 284 lb (128.822 kg)  BMI 47.26 kg/m2  LMP 02/12/2015 (Approximate) General:   alert  Skin:   no rash or abnormalities  Lungs:   clear to auscultation bilaterally  Heart:   regular rate and rhythm, S1, S2 normal, no murmur, click, rub or gallop  Breasts:   several small masses along the breast boarder of sternum and ribs.    Abdomen:  normal findings: no organomegaly, soft, non-tender and no hernia  Pelvis:  External genitalia: normal general appearance Urinary system: urethral meatus normal and bladder without fullness, nontender Vaginal: normal without tenderness, induration or masses, + chunky white vaginal discharge, + gray thin discharge with odor.   Cervix: normal appearance Adnexa: normal bimanual exam, difficult to assess d/t body habitus Uterus: anteverted and non-tender, normal size   Lab Review Urine pregnancy test Labs reviewed yes Radiologic studies reviewed no  50% of 30 min visit spent on counseling and coordination of care.   Assessment:    Healthy female exam.   Smoking cessation counseling BV VVC Morbid obesity ?breast masess bilaterally   Plan:    Education reviewed: depression evaluation, low fat, low cholesterol diet, safe sex/STD prevention, self breast exams, skin cancer screening, smoking cessation and weight bearing exercise. Contraception: tubal ligation. Mammogram ordered. Follow up in: 6 months.     Meds ordered this encounter  Medications  . varenicline (CHANTIX STARTING MONTH PAK) 0.5 MG X 11 & 1 MG X 42 tablet    Sig: Take a 0.5mg   tablet 1X/day for 3 days, increase to twice a day for 4 days, then take 1 mg tablet twice a day.    Dispense:  53 tablet    Refill:  0  . tinidazole (TINDAMAX) 500 MG tablet    Sig: Take 4 tablets (2,000 mg total) by mouth daily with breakfast.    Dispense:  12 tablet    Refill:  0  . fluconazole (DIFLUCAN) 100 MG tablet    Sig: Take 1 tablet (100 mg total) by mouth once. Repeat dose in 48-72 hour.    Dispense:  3 tablet    Refill:  0   Orders Placed This Encounter  Procedures  . SureSwab, Vaginosis/Vaginitis Plus  . MM DIAG BREAST TOMO BILATERAL    multiple lesions along ridge line with both breasts ALONG CLEVAGE AREA,  Standing Status: Future     Number of Occurrences:      Standing Expiration Date: 05/05/2016    Order Specific Question:  Reason for Exam (SYMPTOM  OR DIAGNOSIS REQUIRED)    Answer:  multiple lesions along ridge line with both breasts ALONG CLEVAGE AREA, smoker, family hx of BCA in the 47's    Order Specific Question:  Is the patient pregnant?    Answer:  No    Order Specific Question:  Preferred imaging location?    Answer:  [facility]  . Hemoglobin A1c  . CBC with Differential/Platelet  . Comprehensive metabolic panel  . TSH  . Cholesterol, total  . Triglycerides  . HDL cholesterol  . Referral to Nutrition and Diabetes Services    Referral Priority:  Routine    Referral Type:  Consultation    Referral Reason:  Specialty Services Required    Number of Visits Requested:  1  . Ambulatory referral to General Surgery    Referral Priority:  Routine    Referral Type:  Surgical    Referral Reason:  Specialty Services Required    Requested Specialty:  General Surgery    Number of Visits Requested:  1

## 2015-03-06 ENCOUNTER — Encounter: Payer: Self-pay | Admitting: *Deleted

## 2015-03-06 LAB — PAP IG AND HPV HIGH-RISK: HPV DNA High Risk: NOT DETECTED

## 2015-03-07 ENCOUNTER — Other Ambulatory Visit: Payer: Self-pay | Admitting: Certified Nurse Midwife

## 2015-03-08 LAB — SURESWAB, VAGINOSIS/VAGINITIS PLUS
Atopobium vaginae: 7 Log (cells/mL)
C. PARAPSILOSIS, DNA: NOT DETECTED
C. TRACHOMATIS RNA, TMA: NOT DETECTED
C. albicans, DNA: NOT DETECTED
C. glabrata, DNA: NOT DETECTED
C. tropicalis, DNA: NOT DETECTED
Gardnerella vaginalis: >8 Log (cells/mL)
LACTOBACILLUS SPECIES: NOT DETECTED Log (cells/mL)
MEGASPHAERA SPECIES: 7.9 Log (cells/mL)
N. gonorrhoeae RNA, TMA: NOT DETECTED
T. VAGINALIS RNA, QL TMA: NOT DETECTED

## 2015-03-10 ENCOUNTER — Other Ambulatory Visit: Payer: Self-pay | Admitting: Certified Nurse Midwife

## 2015-03-10 ENCOUNTER — Other Ambulatory Visit: Payer: Self-pay | Admitting: *Deleted

## 2015-03-10 ENCOUNTER — Telehealth: Payer: Self-pay | Admitting: *Deleted

## 2015-03-10 DIAGNOSIS — B9689 Other specified bacterial agents as the cause of diseases classified elsewhere: Secondary | ICD-10-CM

## 2015-03-10 DIAGNOSIS — N760 Acute vaginitis: Principal | ICD-10-CM

## 2015-03-10 MED ORDER — METRONIDAZOLE 500 MG PO TABS: 500.0000 mg | ORAL_TABLET | Freq: Two times a day (BID) | ORAL | Status: DC

## 2015-03-10 NOTE — Telephone Encounter (Signed)
Patient states the medication she was given for BV was not covered and would like an alternative sent in. Flagyl sent to her pharmacy.

## 2015-03-14 ENCOUNTER — Inpatient Hospital Stay: Admission: RE | Admit: 2015-03-14 | Payer: Medicaid Other | Source: Ambulatory Visit

## 2015-03-14 ENCOUNTER — Other Ambulatory Visit: Payer: Medicaid Other

## 2015-03-24 ENCOUNTER — Ambulatory Visit: Payer: Medicaid Other | Admitting: Skilled Nursing Facility1

## 2015-04-22 ENCOUNTER — Encounter: Payer: Medicaid Other | Attending: Internal Medicine | Admitting: Dietician

## 2015-04-22 ENCOUNTER — Encounter: Payer: Self-pay | Admitting: Dietician

## 2015-04-22 VITALS — Ht 65.0 in | Wt 277.5 lb

## 2015-04-22 DIAGNOSIS — Z6841 Body Mass Index (BMI) 40.0 and over, adult: Secondary | ICD-10-CM | POA: Insufficient documentation

## 2015-04-22 DIAGNOSIS — Z713 Dietary counseling and surveillance: Secondary | ICD-10-CM | POA: Diagnosis not present

## 2015-04-22 DIAGNOSIS — IMO0002 Reserved for concepts with insufficient information to code with codable children: Secondary | ICD-10-CM

## 2015-04-22 NOTE — Patient Instructions (Addendum)
Adequate sleep is so important.  Aim for 7-8 hours per night.  Any improvement is beneficial. Consider Pedometer app on your phone.  Aim for 10,000 steps per day. Consider a membership to the gym or [employer] (scholarship program) or walk in the mall or during the cool time of the day. Get sunlight every day. Sources of Omega 3:  Salmon, tuna, walnuts, soy Consider the [employer] ([place]- Saturday 7-12 and Wed 8-1 ([place]-December),  Benefits when you use your EBT card Be mindful about your food choices and appetite. Have breakfast every morning.  Include protein.  Consider reading Animator or [employer]) Have a New Teenager by Friday:  From [place] to [facility] and Responsible in 5 days:  Dr. Timoteo Expose  [employer]  Mobile Crisis Management Crisis Line:  [phone]

## 2015-04-22 NOTE — Progress Notes (Signed)
Medical Nutrition Therapy:  Appt start time: 1115 end time:  1230.   Assessment:  Primary concerns today: Patient is here alone.  She wants to get on track with her eating and lose weight.   As a teen she was a size 14 with unknown weight but reports that weight increased with marriage and children to 28  lbs in June.  She has lost 11 lbs in the past 2 months.   She had a Hgba!c of 5.7 in July of this year which was decreased from 5.8 in March 2015.  Prediabetes. Patient lives with her 47 and 69 year old children.  She works second shift 4 days per week at [employer]  6-16 hour days.  She has financial stress which contributes to depression.   She also relates increased stress due to the behavior of her 36 yo daughter.  She only sleeps 3-4 hours per night.  She will nap occasionally during the day when she is feeling more depressed.  She is outside very little.  She smokes.  Wt Readings from Last 3 Encounters:  04/22/15 277 lb 8 oz (125.873 kg)  03/05/15 284 lb (128.822 kg)  02/19/15 288 lb (130.636 kg)   TANITA  BODY COMP RESULTS 04/22/15 277.5 lbs   BMI (kg/m^2) 46.2   Fat Mass (lbs) 127.5   Fat Free Mass (lbs) 150   Total Body Water (lbs) 110    Preferred Learning Style:   No preference indicated   Learning Readiness:   Ready   MEDICATIONS: see list   DIETARY INTAKE: Lactose intolerant   24-hr recall:  B (9 AM): hot pocket or cereal and 2% milk OR skips Snk ( AM): chips and soda  L (12-3 PM): pizza (Elizabeth's pizza) OR fried or grilled chicken, green beans, potato salad (Mrs. Berle Mull) Snk ( PM): chips and soda or juice D (7:30 or 11:00 PM when working): Home:  Grilled chicken, mixed veges, cauliflower, broccoli, carrots OR Work:  Frozen dinner OR vending machine (NABS) or chips Snk ( PM): none on work days, days off:  NABS, chips Beverages: regular soda, water, sweet tea, lemonade, juice, gatorade  Usual physical activity:  Walking at work, wants to join a  gym  Estimated energy needs: 1900 calories 200 g carbohydrates 135 g protein 50 g fat  Progress Towards Goal(s):  In progress.   Nutritional Diagnosis:  NB-1.1 Food and nutrition-related knowledge deficit As related to eating for healthy weight.  As evidenced by patient report and diet hx..    Intervention:  Nutrition education/counseling for weight loss.  Also discussed items that are contributing to depression and means to control stress as important factors to improve overall health and wellbeing.  Discussed portion sizes and basics of meal planning (balanced plate).  Needs for focus on prediabetes further in next appointment.  Did discuss importance of exercise and weight loss on health.  Goals to reduce to 250 lbs.  Discussed mindful eating and including protein with all meals/snacks.  Discussion of beverages as well but will need to be reviewed at next appointment.  Adequate sleep is so important.  Aim for 7-8 hours per night.  Any improvement is beneficial. Consider Pedometer app on your phone.  Aim for 10,000 steps per day. Consider a membership to the gym or [employer] (scholarship program) or walk in the mall or during the cool time of the day. Get sunlight every day. Sources of Omega 3:  Salmon, tuna, walnuts, soy Consider the [employer] ([place]- Saturday 7-12 and Wed 8-1 ([place]-December),  Benefits when you use your EBT card Be mindful about your food choices and appetite. Have breakfast every morning.  Include protein.  Consider reading An[employer]ator or [employer]) Have a New Teenager by Friday:  From [place] and [place] to [facility]ful and Responsible in 5 days:  Dr. Timoteo Expose  [facility] [employer]  Mobile Crisis Management Crisis Line:  [phone]  Teaching Method Utilized:  Visual Auditory Hands on  Handouts given during visit include:  My plate  Snack list  Breakfast ideas  Weight loss tips (LW)  Barriers to learning/adherence to  lifestyle change: increased stress/depression  Demonstrated degree of understanding via:  Teach Back   Monitoring/Evaluation:  Dietary intake, exercise, and body weight in 1 month(s).

## 2015-05-06 ENCOUNTER — Emergency Department (INDEPENDENT_AMBULATORY_CARE_PROVIDER_SITE_OTHER)
Admission: EM | Admit: 2015-05-06 | Discharge: 2015-05-06 | Disposition: A | Discharge status: Home / Self Care | Payer: Medicaid Other | Source: Home / Self Care | Attending: Family Medicine | Admitting: Family Medicine

## 2015-05-06 DIAGNOSIS — R600 Localized edema: Secondary | ICD-10-CM | POA: Diagnosis not present

## 2015-05-06 MED ORDER — FUROSEMIDE 20 MG PO TABS: 20.0000 mg | ORAL_TABLET | Freq: Every day | ORAL | Status: DC

## 2015-05-06 NOTE — ED Notes (Signed)
C/o bilateral foot swelling for three days States feet are painful and does tingles Does work on Psychologist, educational as tx

## 2015-05-06 NOTE — Discharge Instructions (Signed)
If you're edema (swelling) does not resolve in the next 2-3 days, to see her primary care provider so that he can do further testing.

## 2015-05-06 NOTE — ED Provider Notes (Signed)
CSN: [id number]     Arrival date & time 05/06/15  1542 History   First MD Initiated Contact with Patient 05/06/15 1620     Chief Complaint  Patient presents with  . 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 She is 39 years old and comes in with her partner. She's had several days of progressive swelling in her feet. She's been exposed to more heat lately and she has been eating more salty foods as well.  She denies any calf pain but she does have some knee aching after standing a normal shift. She has no thigh tenderness. She denies any shortness of breath or chest pain.  The history is provided by the patient.    Past Medical History  Diagnosis Date  . BV (bacterial vaginosis)   . GERD (gastroesophageal reflux disease)    Past Surgical History  Procedure Laterality Date  . Tubal ligation    . Dilation and curettage of uterus     Family History  Problem Relation Age of Onset  . Diabetes Mother   . Hypertension Mother   . Hyperlipidemia Mother    Social History  Substance Use Topics  . Smoking status: Current Every Day Smoker -- 0.50 packs/day  . Smokeless tobacco: Never Used  . Alcohol Use: No   OB History    Gravida Para Term Preterm AB TAB SAB Ectopic Multiple Living   3 2 2  1  1   2      Review of Systems  Constitutional: Negative.   HENT: Negative.   Eyes: Negative.   Respiratory: Negative.   Cardiovascular: Negative.     Allergies  Review of patient's allergies indicates no known allergies.  Home Medications   Prior to Admission medications   Medication Sig Start Date End Date Taking? Authorizing Provider  cyclobenzaprine (FLEXERIL) 10 MG tablet Take 1 tablet (10 mg total) by mouth 2 (two) times daily as needed for muscle spasms. Patient not taking: Reported on 02/19/2015 12/07/14   [name],  PA-C  fluconazole (DIFLUCAN) 100 MG tablet Take 1 tablet (100 mg total) by mouth once. Repeat dose in 48-72 hour. Patient not taking: Reported on 04/22/2015 03/05/15   Rachelle A Denney, CNM  ibuprofen (ADVIL,MOTRIN) 800 MG tablet Take 1 tablet (800 mg total) by mouth 3 (three) times daily. 12/07/14   [name], PA-C  metroNIDAZOLE (FLAGYL) 500 MG tablet Take 1 tablet (500 mg total) by mouth 2 (two) times daily. Patient not taking: Reported on 04/22/2015 03/10/15   [name], MD  varenicline (CHANTIX STARTING MONTH PAK) 0.5 MG X 11 & 1 MG X 42 tablet Take a 0.5mg  tablet 1X/day for 3 days, increase to twice a day for 4 days, then take 1 mg tablet twice a day. Patient not taking: Reported on 04/22/2015 03/05/15   [name], CNM   Meds Ordered and Administered this Visit  Medications - No data to display  BP 142/89 mmHg  Pulse 110  Temp(Src) 97.4 F (36.3 C) (Oral)  Resp 16  SpO2 99%  LMP 05/02/2015 No data found.   Physical Exam  Constitutional: She is oriented to person, place, and time. She appears well-developed.  HENT:  Head: Normocephalic and atraumatic.  Mouth/Throat: Oropharynx is clear and moist.  Eyes: Conjunctivae are normal. Pupils are equal, round, and reactive to light.  Neck: Normal range of motion. Neck supple.  Cardiovascular: Normal rate, regular rhythm and normal heart sounds.   Pulmonary/Chest: Effort normal and breath sounds normal.  Musculoskeletal: She exhibits edema.  Patient has bilateral 2+ edema, worse on the right. She has good pedal pulses. She has no significant calf tenderness, erythema, or cords, negative Homans  Neurological: She is alert and oriented to person, place, and time.  Skin: Skin is warm and dry. No rash noted. No erythema. No pallor.  Psychiatric: She has a normal mood and affect. Her behavior is normal.  Nursing note and vitals reviewed.   ED Course  Procedures (including critical care time)  Labs Review Labs Reviewed - No  data to display       MDM       ICD-9-CM ICD-10-CM   1. Pedal edema 782.3 R60.0   2. Morbid obesity 278.01 E66.01      Signed, Robyn Haber, MD     Robyn Haber, MD 05/06/15 [phone]

## 2015-05-26 ENCOUNTER — Encounter: Payer: Medicaid Other | Admitting: Dietician

## 2015-06-26 ENCOUNTER — Telehealth: Payer: Self-pay | Admitting: *Deleted

## 2015-06-26 NOTE — Telephone Encounter (Signed)
Patient is calling the office to discuss lab results from [place]? 5:24 LM on VM to CB

## 2015-07-10 NOTE — Telephone Encounter (Signed)
Patient did not call back- call refiled. 

## 2015-08-06 ENCOUNTER — Encounter (HOSPITAL_COMMUNITY): Payer: Self-pay | Admitting: Emergency Medicine

## 2015-08-06 ENCOUNTER — Emergency Department (HOSPITAL_COMMUNITY)
Admission: EM | Admit: 2015-08-06 | Discharge: 2015-08-06 | Disposition: A | Discharge status: Home / Self Care | Payer: Medicaid Other | Attending: Emergency Medicine | Admitting: Emergency Medicine

## 2015-08-06 DIAGNOSIS — F17200 Nicotine dependence, unspecified, uncomplicated: Secondary | ICD-10-CM | POA: Insufficient documentation

## 2015-08-06 DIAGNOSIS — M79605 Pain in left leg: Secondary | ICD-10-CM

## 2015-08-06 DIAGNOSIS — Y9389 Activity, other specified: Secondary | ICD-10-CM | POA: Diagnosis not present

## 2015-08-06 DIAGNOSIS — Z8719 Personal history of other diseases of the digestive system: Secondary | ICD-10-CM | POA: Diagnosis not present

## 2015-08-06 DIAGNOSIS — Z8742 Personal history of other diseases of the female genital tract: Secondary | ICD-10-CM | POA: Diagnosis not present

## 2015-08-06 DIAGNOSIS — Y998 Other external cause status: Secondary | ICD-10-CM | POA: Insufficient documentation

## 2015-08-06 DIAGNOSIS — Z79899 Other long term (current) drug therapy: Secondary | ICD-10-CM | POA: Diagnosis not present

## 2015-08-06 DIAGNOSIS — Z791 Long term (current) use of non-steroidal anti-inflammatories (NSAID): Secondary | ICD-10-CM | POA: Insufficient documentation

## 2015-08-06 DIAGNOSIS — W228XXA Striking against or struck by other objects, initial encounter: Secondary | ICD-10-CM | POA: Insufficient documentation

## 2015-08-06 DIAGNOSIS — R200 Anesthesia of skin: Secondary | ICD-10-CM | POA: Diagnosis not present

## 2015-08-06 DIAGNOSIS — Y9289 Other specified places as the place of occurrence of the external cause: Secondary | ICD-10-CM | POA: Insufficient documentation

## 2015-08-06 DIAGNOSIS — S8992XA Unspecified injury of left lower leg, initial encounter: Secondary | ICD-10-CM | POA: Insufficient documentation

## 2015-08-06 MED ORDER — KETOROLAC TROMETHAMINE 60 MG/2ML IM SOLN
60.0000 mg | Freq: Once | INTRAMUSCULAR | Status: AC
  Administered 2015-08-06: 60 mg via INTRAMUSCULAR
  Filled 2015-08-06: qty 2

## 2015-08-06 NOTE — ED Notes (Signed)
Pt able to ambulate independently 

## 2015-08-06 NOTE — ED Provider Notes (Signed)
CSN: [id number]   Arrival date & time 08/06/15 1434  History  By signing my name below, I, Terri Duran, attest that this documentation has been prepared under the direction and in the presence of Lahoma Crocker Hadessah Grennan PA-C Electronically Signed: Altamease Duran, ED Scribe. 08/06/2015. 3:58 PM. Chief Complaint  Patient presents with  . Leg Pain    HPI The history is provided by the patient. No language interpreter was used.   Terri Duran is a 39 y.o. female who presents to the Emergency Department complaining of intermittent, aching, pain and numbness at the anterior left lower leg with onset 1 month ago after striking the shin on a conveyor belt. Pt states that initially there was a large bruise that revolved but the pain has persisted. The pain is present intermittently everyday and is exacerbated by cold air. She reports that the pain radiates up the leg to the hip. Pt has been ambulatory but states that she occasionally walks with a limp. Ibuprofen provides pain relief but she reports that the pain returns after a few hours.  Associated symptoms include numbness at the left great toe. Pt denies new injury. She has no PCP. She has no other complaints today.  Past Medical History  Diagnosis Date  . BV (bacterial vaginosis)   . GERD (gastroesophageal reflux disease)     Past Surgical History  Procedure Laterality Date  . Tubal ligation    . Dilation and curettage of uterus      Family History  Problem Relation Age of Onset  . Diabetes Mother   . Hypertension Mother   . Hyperlipidemia Mother     Social History  Substance Use Topics  . Smoking status: Current Every Day Smoker -- 0.50 packs/day  . Smokeless tobacco: Never Used  . Alcohol Use: No     Review of Systems  Musculoskeletal: Positive for arthralgias (LLE).  Neurological: Positive for numbness (LLE  and left great toe).  All other systems reviewed and are negative.  Home Medications   Prior to Admission medications    Medication Sig Start Date End Date Taking? Authorizing Provider  cyclobenzaprine (FLEXERIL) 10 MG tablet Take 1 tablet (10 mg total) by mouth 2 (two) times daily as needed for muscle spasms. Patient not taking: Reported on 02/19/2015 12/07/14   [name], PA-C  fluconazole (DIFLUCAN) 100 MG tablet Take 1 tablet (100 mg total) by mouth once. Repeat dose in 48-72 hour. Patient not taking: Reported on 04/22/2015 03/05/15   [name], CNM  furosemide (LASIX) 20 MG tablet Take 1 tablet (20 mg total) by mouth daily. 05/06/15   Robyn Haber, MD  ibuprofen (ADVIL,MOTRIN) 800 MG tablet Take 1 tablet (800 mg total) by mouth 3 (three) times daily. 12/07/14   [name], PA-C  metroNIDAZOLE (FLAGYL) 500 MG tablet Take 1 tablet (500 mg total) by mouth 2 (two) times daily. Patient not taking: Reported on 04/22/2015 03/10/15   [name], MD  varenicline (CHANTIX STARTING MONTH PAK) 0.5 MG X 11 & 1 MG X 42 tablet Take a 0.5mg  tablet 1X/day for 3 days, increase to twice a day for 4 days, then take 1 mg tablet twice a day. Patient not taking: Reported on 04/22/2015 03/05/15   [name], CNM    Allergies  Review of patient's allergies indicates no known allergies.  Triage Vitals: BP 117/70 mmHg  Pulse 88  Temp(Src) 98.5 F (36.9 C) (Oral)  Resp 16  Ht 5\' 5"  (1.651 m)  Wt  160 lb (72.576 kg)  BMI 26.63 kg/m2  SpO2 99%  Physical Exam  Constitutional: She appears well-developed and well-nourished. No distress.  HENT:  Head: Normocephalic and atraumatic.  Right Ear: External ear normal.  Left Ear: External ear normal.  Eyes: Conjunctivae are normal. Right eye exhibits no discharge. Left eye exhibits no discharge. No scleral icterus.  Neck: Normal range of motion.  Cardiovascular: Normal rate and intact distal pulses.   Popliteal and pedal pulses palpable bilaterally. No tenderness, erythema, palpable cords or unilateral leg swelling noted. Negative Homans.  Pulmonary/Chest: Effort  normal.  Musculoskeletal: Normal range of motion.       Left lower leg: She exhibits tenderness. She exhibits no bony tenderness, no swelling, no edema and no deformity.       Legs: Mild tenderness over the anterior left lower extremity. No bony tenderness. No edema, ecchymosis or deformity noted. Compartments are soft. Walks with steady gait. Full painless range of motion at the hip, knee, ankle and toes.  Neurological: She is alert. Coordination normal.  5 out of 5 strength in the bilateral lower extremities. Sensation to light touch intact throughout including the areas of reported numbness.  Skin: Skin is warm and dry. No erythema. No pallor.  No ecchymosis, erythema or any other skin changes over the bilateral lower extremities.  Psychiatric: She has a normal mood and affect. Her behavior is normal.  Nursing note and vitals reviewed.   ED Course  Procedures  DIAGNOSTIC STUDIES: Oxygen Saturation is 99% on RA,  normal by my interpretation.    COORDINATION OF CARE: 3:52 PM Discussed treatment plan which includes pain management with pt at bedside and pt agreed to the plan.  Labs Review- Labs Reviewed - No data to display  Imaging Review No results found.  MDM   Final diagnoses:  Pain of left lower extremity   Patient presenting with left lower leg pain. Left leg is neurovascularly intact with FROM. Due to injury being over 50 month old, no imaging is indicated at this time. Pain managed in ED with Toradol. Pt is able to ambulate with a steady gait. Conservative therapy recommended. Discussed RICE therapy and use of OTC pain relievers. Pt advised to follow up with PCP if symptoms persist.  At this time there does not appear to be any evidence of an acute emergency medical condition and the patient appears stable for discharge with appropriate outpatient follow up. Diagnosis was discussed with patient who verbalizes understanding and is agreeable to discharge. Return precautions given  in discharge paperwork and discussed with pt at bedside. Pt is stable for discharge.  I personally performed the services described in this documentation, which was scribed in my presence. The recorded information has been reviewed and is accurate.       Josephina Gip, PA-C 08/06/15 1646  Ripley Fraise, MD 08/07/15 1705

## 2015-08-06 NOTE — Discharge Instructions (Signed)
Alternate taking advil and tylenol. Schedule a follow up appointment with community health and wellness.   Musculoskeletal Pain Musculoskeletal pain is muscle and boney aches and pains. These pains can occur in any part of the body. Your caregiver may treat you without knowing the cause of the pain. They may treat you if blood or urine tests, X-rays, and other tests were normal.  CAUSES There is often not a definite cause or reason for these pains. These pains may be caused by a type of germ (virus). The discomfort may also come from overuse. Overuse includes working out too hard when your body is not fit. Boney aches also come from weather changes. Bone is sensitive to atmospheric pressure changes. HOME CARE INSTRUCTIONS   Ask when your test results will be ready. Make sure you get your test results.  Only take over-the-counter or prescription medicines for pain, discomfort, or fever as directed by your caregiver. If you were given medications for your condition, do not drive, operate machinery or power tools, or sign legal documents for 24 hours. Do not drink alcohol. Do not take sleeping pills or other medications that may interfere with treatment.  Continue all activities unless the activities cause more pain. When the pain lessens, slowly resume normal activities. Gradually increase the intensity and duration of the activities or exercise.  During periods of severe pain, bed rest may be helpful. Lay or sit in any position that is comfortable.  Putting ice on the injured area.  Put ice in a bag.  Place a towel between your skin and the bag.  Leave the ice on for 15 to 20 minutes, 3 to 4 times a day.  Follow up with your caregiver for continued problems and no reason can be found for the pain. If the pain becomes worse or does not go away, it may be necessary to repeat tests or do additional testing. Your caregiver may need to look further for a possible cause. SEEK IMMEDIATE MEDICAL  CARE IF:  You have pain that is getting worse and is not relieved by medications.  You develop chest pain that is associated with shortness or breath, sweating, feeling sick to your stomach (nauseous), or throw up (vomit).  Your pain becomes localized to the abdomen.  You develop any new symptoms that seem different or that concern you. MAKE SURE YOU:   Understand these instructions.  Will watch your condition.  Will get help right away if you are not doing well or get worse.   This information is not intended to replace advice given to you by your health care provider. Make sure you discuss any questions you have with your health care provider.   Document Released: 08/16/2005 Document Revised: 11/08/2011 Document Reviewed: 04/20/2013 Elsevier Interactive Patient Education [employer].

## 2015-08-06 NOTE — ED Notes (Signed)
Pt sts left leg pain x 1 month to top of leg into hip; pt sts hx of fall in past

## 2015-08-06 NOTE — ED Notes (Signed)
PA at bedside at this time. Please see PA note for secondary assessment

## 2015-09-05 ENCOUNTER — Ambulatory Visit: Payer: Medicaid Other | Admitting: Certified Nurse Midwife

## 2015-09-10 ENCOUNTER — Telehealth: Payer: Self-pay | Admitting: Certified Nurse Midwife

## 2015-09-10 ENCOUNTER — Ambulatory Visit: Payer: Medicaid Other | Admitting: Certified Nurse Midwife

## 2015-09-23 NOTE — Telephone Encounter (Signed)
UNABLE TO REACH PATIENT

## 2015-10-10 ENCOUNTER — Other Ambulatory Visit: Payer: Self-pay | Admitting: Certified Nurse Midwife

## 2015-10-13 ENCOUNTER — Other Ambulatory Visit: Payer: Medicaid Other

## 2016-04-13 ENCOUNTER — Ambulatory Visit: Payer: Self-pay | Admitting: Certified Nurse Midwife

## 2016-05-13 ENCOUNTER — Ambulatory Visit: Payer: Medicaid Other | Admitting: Obstetrics

## 2016-06-04 ENCOUNTER — Ambulatory Visit (INDEPENDENT_AMBULATORY_CARE_PROVIDER_SITE_OTHER): Payer: Medicaid Other

## 2016-06-04 ENCOUNTER — Encounter (HOSPITAL_COMMUNITY): Payer: Self-pay | Admitting: Emergency Medicine

## 2016-06-04 ENCOUNTER — Ambulatory Visit (HOSPITAL_COMMUNITY)
Admission: EM | Admit: 2016-06-04 | Discharge: 2016-06-04 | Disposition: A | Discharge status: Home / Self Care | Payer: Medicaid Other | Attending: Family Medicine | Admitting: Family Medicine

## 2016-06-04 DIAGNOSIS — M25552 Pain in left hip: Secondary | ICD-10-CM

## 2016-06-04 DIAGNOSIS — M5432 Sciatica, left side: Secondary | ICD-10-CM

## 2016-06-04 MED ORDER — CYCLOBENZAPRINE HCL 10 MG PO TABS: 10.0000 mg | ORAL_TABLET | Freq: Two times a day (BID) | ORAL | 0 refills | Status: DC | PRN

## 2016-06-04 MED ORDER — OXYCODONE-ACETAMINOPHEN 10-325 MG PO TABS: 1.0000 | ORAL_TABLET | ORAL | 0 refills | Status: DC | PRN

## 2016-06-04 NOTE — ED Triage Notes (Signed)
The patient presented to the [facility] with a complaint of left hip pain and tingling that radiates down her left leg secondary to a fall 3 days ago.

## 2016-06-04 NOTE — ED Provider Notes (Signed)
CSN: [id number]     Arrival date & time 06/04/16  1355 History   First MD Initiated Contact with Patient 06/04/16 1618     Chief Complaint  Patient presents with  . Hip Pain   (Consider location/radiation/quality/duration/timing/severity/associated sxs/prior Treatment) HPI This is a new problem: Pt fell 2-3 days ago after slipping on wooden steps and landing on her left buttock. Since that time she has had pain in her left buttock radiating down her left leg and causing some tingling. No numbness. Pain score is 5, tylenol and Advil not helping. There is some relief with rest and hot compresses. Walking and standing worsens pain.  Past Medical History:  Diagnosis Date  . BV (bacterial vaginosis)   . GERD (gastroesophageal reflux disease)    Past Surgical History:  Procedure Laterality Date  . DILATION AND CURETTAGE OF UTERUS    . TUBAL LIGATION     Family History  Problem Relation Age of Onset  . Diabetes Mother   . Hypertension Mother   . Hyperlipidemia Mother    Social History  Substance Use Topics  . Smoking status: Current Every Day Smoker    Packs/day: 0.50  . Smokeless tobacco: Never Used  . Alcohol use No   OB History    Gravida Para Term Preterm AB Living   3 2 2   1 2    SAB TAB Ectopic Multiple Live Births   1       2     Review of Systems  Denies: HEADACHE, NAUSEA, ABDOMINAL PAIN, CHEST PAIN, CONGESTION, DYSURIA, SHORTNESS OF BREATH  Allergies  Review of patient's allergies indicates no known allergies.  Home Medications   Prior to Admission medications   Medication Sig Start Date End Date Taking? Authorizing Provider  cyclobenzaprine (FLEXERIL) 10 MG tablet Take 1 tablet (10 mg total) by mouth 2 (two) times daily as needed for muscle spasms. Patient not taking: Reported on 02/19/2015 12/07/14   [name], PA-C  fluconazole (DIFLUCAN) 100 MG tablet Take 1 tablet (100 mg total) by mouth once. Repeat dose in 48-72 hour. Patient not taking: Reported on  04/22/2015 03/05/15   [name], CNM  furosemide (LASIX) 20 MG tablet Take 1 tablet (20 mg total) by mouth daily. 05/06/15   Robyn Haber, MD  ibuprofen (ADVIL,MOTRIN) 800 MG tablet Take 1 tablet (800 mg total) by mouth 3 (three) times daily. 12/07/14   [name], PA-C  metroNIDAZOLE (FLAGYL) 500 MG tablet Take 1 tablet (500 mg total) by mouth 2 (two) times daily. Patient not taking: Reported on 04/22/2015 03/10/15   [name], MD  varenicline (CHANTIX STARTING MONTH PAK) 0.5 MG X 11 & 1 MG X 42 tablet Take a 0.5mg  tablet 1X/day for 3 days, increase to twice a day for 4 days, then take 1 mg tablet twice a day. Patient not taking: Reported on 04/22/2015 03/05/15   [name], CNM   Meds Ordered and Administered this Visit  Medications - No data to display  BP 140/88 (BP Location: Left Arm)   Pulse 73   Temp 97.3 F (36.3 C) (Oral)   Resp 12   LMP 05/29/2016 (Exact Date)   SpO2 100%  No data found.   Physical Exam NURSES NOTES AND VITAL SIGNS REVIEWED. CONSTITUTIONAL: Well developed, well nourished, no acute distress HEENT: normocephalic, atraumatic EYES: Conjunctiva normal NECK:normal ROM, supple, no adenopathy PULMONARY:No respiratory distress, normal effort ABDOMINAL: Soft, ND, NT BS+, No CVAT MUSCULOSKELETAL: Normal ROM of all extremities,  Left buttock is tender to palpation.   SKIN: warm and dry without rash PSYCHIATRIC: Mood and affect, behavior are normal  Urgent Care Course   Clinical Course    Procedures (including critical care time)  Labs Review Labs Reviewed - No data to display  Imaging Review Dg Hip Unilat W Or Wo Pelvis 1 View Left  Result Date: 06/04/2016 CLINICAL DATA:  Fall. EXAM: DG HIP (WITH OR WITHOUT PELVIS) 1V*L* COMPARISON:  No recent prior. FINDINGS: No acute bony or joint abnormality identified. No evidence fracture. IMPRESSION: No acute or focal abnormality. Electronically Signed   By: Marcello Moores  Register   On: 06/04/2016 16:38      Visual Acuity Review  Right Eye Distance:   Left Eye Distance:   Bilateral Distance:    Right Eye Near:   Left Eye Near:    Bilateral Near:         MDM   1. Left hip pain   2. Sciatica of left side without back pain     Patient is reassured that there are no issues that require transfer to higher level of care at this time or additional tests. Patient is advised to continue home symptomatic treatment. Patient is advised that if there are new or worsening symptoms to attend the emergency department, contact primary care provider, or return to UC. Instructions of care provided discharged home in stable condition.    THIS NOTE WAS GENERATED USING A VOICE RECOGNITION SOFTWARE PROGRAM. ALL REASONABLE EFFORTS  WERE MADE TO PROOFREAD THIS DOCUMENT FOR ACCURACY.  I have verbally reviewed the discharge instructions with the patient. A printed AVS was given to the patient.  All questions were answered prior to discharge.      [name], [place] 06/04/16 1715

## 2016-07-02 ENCOUNTER — Emergency Department (HOSPITAL_COMMUNITY)
Admission: EM | Admit: 2016-07-02 | Discharge: 2016-07-02 | Disposition: A | Discharge status: Home / Self Care | Payer: Medicaid Other | Attending: Emergency Medicine | Admitting: Emergency Medicine

## 2016-07-02 ENCOUNTER — Emergency Department (HOSPITAL_BASED_OUTPATIENT_CLINIC_OR_DEPARTMENT_OTHER): Payer: Medicaid Other

## 2016-07-02 ENCOUNTER — Encounter (HOSPITAL_COMMUNITY): Payer: Self-pay | Admitting: Emergency Medicine

## 2016-07-02 DIAGNOSIS — R42 Dizziness and giddiness: Secondary | ICD-10-CM

## 2016-07-02 DIAGNOSIS — W19XXXA Unspecified fall, initial encounter: Secondary | ICD-10-CM | POA: Insufficient documentation

## 2016-07-02 DIAGNOSIS — Y929 Unspecified place or not applicable: Secondary | ICD-10-CM | POA: Insufficient documentation

## 2016-07-02 DIAGNOSIS — F17200 Nicotine dependence, unspecified, uncomplicated: Secondary | ICD-10-CM | POA: Insufficient documentation

## 2016-07-02 DIAGNOSIS — Y999 Unspecified external cause status: Secondary | ICD-10-CM | POA: Insufficient documentation

## 2016-07-02 DIAGNOSIS — Y939 Activity, unspecified: Secondary | ICD-10-CM | POA: Diagnosis not present

## 2016-07-02 DIAGNOSIS — M5432 Sciatica, left side: Secondary | ICD-10-CM | POA: Diagnosis not present

## 2016-07-02 DIAGNOSIS — M79605 Pain in left leg: Secondary | ICD-10-CM | POA: Diagnosis present

## 2016-07-02 MED ORDER — PREDNISONE 20 MG PO TABS: 40.0000 mg | ORAL_TABLET | Freq: Every day | ORAL | 0 refills | Status: DC

## 2016-07-02 MED ORDER — IBUPROFEN 400 MG PO TABS
800.0000 mg | ORAL_TABLET | Freq: Once | ORAL | Status: AC
  Administered 2016-07-02: 800 mg via ORAL
  Filled 2016-07-02: qty 2

## 2016-07-02 NOTE — Progress Notes (Signed)
*  Preliminary Results* Left lower extremity venous duplex completed. Left lower extremity is negative for deep vein thrombosis. There is no evidence of left Baker's cyst.  07/02/2016 11:32 AM  Maudry Mayhew, BS, RVT, RDCS, RDMS

## 2016-07-02 NOTE — ED Notes (Signed)
Patient going to vascular lab

## 2016-07-02 NOTE — ED Triage Notes (Signed)
Pt sts left leg pain x several weeks across the  Front of her leg; pt denies swelling

## 2016-07-02 NOTE — ED Provider Notes (Signed)
[name] Provider Note   By signing my name below, I, Terri Duran, attest that this documentation has been prepared under the direction and in the presence of Terri Duran, [place]. Electronically Signed: Bea Duran, ED Scribe. 07/02/16. 11:50 AM.   History   Chief Complaint Chief Complaint  Patient presents with  . Leg Pain     The history is provided by the patient and medical records. No language interpreter was used.    HPI Comments:  Terri Duran is an obese 40 y.o. female who presents to the Emergency Department complaining of burning left anterior leg pain that began two weeks ago. She reports associated numbness of the left toes and foot. She states she fell two weeks ago and was seen in the ED and was told she may have injured her sciatic nerve. She has been taking Percocet she was given at her last visit for the fall. She states it is too strong for her so she began taking Ibuprofen but it is not helping. Sitting for long periods of time increases her pain. She denies alleviating factors. She denies tingling or weakness of the LLE, calf swelling, bruising, wounds, fever, chills.    Past Medical History:  Diagnosis Date  . BV (bacterial vaginosis)   . GERD (gastroesophageal reflux disease)     Patient Active Problem List   Diagnosis Date Noted  . Morbid obesity ([place]) 03/05/2015  . Family history of diabetes mellitus (DM) 11/21/2013  . High BMI 11/21/2013  . Smoker 11/21/2013    Past Surgical History:  Procedure Laterality Date  . DILATION AND CURETTAGE OF UTERUS    . TUBAL LIGATION      OB History    Gravida Para Term Preterm AB Living   3 2 2   1 2    SAB TAB Ectopic Multiple Live Births   1       2       Home Medications    Prior to Admission medications   Medication Sig Start Date End Date Taking? Authorizing Provider  cyclobenzaprine (FLEXERIL) 10 MG tablet Take 1 tablet (10 mg total) by mouth 2 (two) times daily as needed  for muscle spasms. Patient not taking: Reported on 02/19/2015 12/07/14   [name], PA-C  cyclobenzaprine (FLEXERIL) 10 MG tablet Take 1 tablet (10 mg total) by mouth 2 (two) times daily as needed for muscle spasms. 06/04/16   [name], PA  fluconazole (DIFLUCAN) 100 MG tablet Take 1 tablet (100 mg total) by mouth once. Repeat dose in 48-72 hour. Patient not taking: Reported on 04/22/2015 03/05/15   [name], CNM  furosemide (LASIX) 20 MG tablet Take 1 tablet (20 mg total) by mouth daily. 05/06/15   Robyn Haber, MD  ibuprofen (ADVIL,MOTRIN) 800 MG tablet Take 1 tablet (800 mg total) by mouth 3 (three) times daily. 12/07/14   [name], PA-C  metroNIDAZOLE (FLAGYL) 500 MG tablet Take 1 tablet (500 mg total) by mouth 2 (two) times daily. Patient not taking: Reported on 04/22/2015 03/10/15   [name], MD  oxyCODONE-acetaminophen (PERCOCET) 10-325 MG tablet Take 1 tablet by mouth every 4 (four) hours as needed for pain. 06/04/16   [name], PA  predniSONE (DELTASONE) 20 MG tablet Take 2 tablets (40 mg total) by mouth daily. Take 40 mg by mouth daily for 3 days, then 20mg  by mouth daily for 3 days, then 10mg  daily for 3 days 07/02/16   [name], PA-C  varenicline (CHANTIX STARTING MONTH PAK) 0.5 MG X 11 & 1 MG X 42 tablet Take a 0.5mg  tablet 1X/day for 3 days, increase to twice a day for 4 days, then take 1 mg tablet twice a day. Patient not taking: Reported on 04/22/2015 03/05/15   [name], CNM    Family History Family History  Problem Relation Age of Onset  . Diabetes Mother   . Hypertension Mother   . Hyperlipidemia Mother     Social History Social History  Substance Use Topics  . Smoking status: Current Every Day Smoker    Packs/day: 0.50  . Smokeless tobacco: Never Used  . Alcohol use No     Allergies   Review of patient's allergies indicates no known allergies.   Review of Systems Review of Systems  Constitutional: Negative for chills  and fever.  Cardiovascular: Negative for leg swelling.  Musculoskeletal: Positive for arthralgias.  Skin: Negative for color change and wound.  Neurological: Positive for numbness. Negative for weakness.     Physical Exam Updated Vital Signs BP 121/72 (BP Location: Left Arm)   Pulse 83   Temp 97.7 F (36.5 C) (Oral)   Resp 18   LMP 05/29/2016 (Exact Date)   SpO2 100%   Physical Exam Physical Exam  Constitutional: Pt appears well-developed and well-nourished. No distress.  HENT:  Head: Normocephalic and atraumatic.  Mouth/Throat: Oropharynx is clear and moist. No oropharyngeal exudate.  Eyes: Conjunctivae are normal.  Neck: Normal range of motion. Neck supple.  No meningismus Cardiovascular: Normal rate, regular rhythm and intact distal pulses.   Pulmonary/Chest: Effort normal and breath sounds normal. No respiratory distress. Pt has no wheezes.  Abdominal: Pt exhibits no distension Musculoskeletal:  Left buttock tender to palpation, no bony CTLS spine tenderness, deformity, step-off, or crepitus Lymphadenopathy: Pt has no cervical adenopathy.  Neurological: Pt is alert and oriented Speech is clear and goal oriented, follows commands Normal 5/5 strength in upper and lower extremities bilaterally including dorsiflexion and plantar flexion Sensation intact Great toe extension intact Moves extremities without ataxia, coordination intact Normal gait Normal balance No Clonus Skin: Skin is warm and dry. No rash noted. Pt is not diaphoretic. No erythema.  Psychiatric: Pt has a normal mood and affect. Behavior is normal.  Nursing note and vitals reviewed.   ED Treatments / Results  DIAGNOSTIC STUDIES: Oxygen Saturation is 100% on RA, normal by my interpretation.   COORDINATION OF CARE: 10:47 AM- Will prescribe Prednisone. Will give referral to specialist. Return precautions discussed. Will order doppler study at pt's request due to concern for DVT. Will order dose of  Ibuprofen prior to discharge. Pt verbalizes understanding and agrees to plan.  Medications  ibuprofen (ADVIL,MOTRIN) tablet 800 mg (800 mg Oral Given 07/02/16 1111)    Labs (all labs ordered are listed, but only abnormal results are displayed) Labs Reviewed - No data to display  EKG  EKG Interpretation None       Radiology No results found.  Procedures Procedures (including critical care time)  Medications Ordered in ED Medications  ibuprofen (ADVIL,MOTRIN) tablet 800 mg (800 mg Oral Given 07/02/16 1111)     Initial Impression / Assessment and Plan / ED Course  I have reviewed the triage vital signs and the nursing notes.  Pertinent labs & imaging results that were available during my care of the patient were reviewed by me and considered in my medical decision making (see chart for details).  Clinical Course    Patient with  left low back and buttock pain that radiates to the left leg and left shin. Patient fell several weeks ago landing on her left buttocks. I suspect that her symptoms are lumbar radiculopathy or sciatica. She denies any other injuries. She states that the symptoms are worsened after sitting for long periods of time. She was given prescription Roxicodone, but states this too strong for her. She is tried managing her symptoms with ibuprofen without relief. I recommend prednisone for her lumbar radiculopathy/sciatica and PCP/ortho follow-up.  She is very concerned that she has a DVT.  I do not believe this to be the case, but patient is insistent upon getting an [place].  As this is a non-invasive test and will give the patient significant emotional relief, will confirm the abscess of DVT with [place].  DVT study negative.  I personally performed the services described in this documentation, which was scribed in my presence. The recorded information has been reviewed and is accurate.     Final Clinical Impressions(s) / ED Diagnoses   Final diagnoses:  Sciatica of  left side    New Prescriptions New Prescriptions   PREDNISONE (DELTASONE) 20 MG TABLET    Take 2 tablets (40 mg total) by mouth daily. Take 40 mg by mouth daily for 3 days, then 20mg  by mouth daily for 3 days, then 10mg  daily for 3 days     [name], PA-C 07/02/16 1151    Quintella Reichert, MD 07/03/16 [phone]

## 2016-07-02 NOTE — ED Notes (Signed)
Coupon [facility] given to patient.

## 2016-08-03 ENCOUNTER — Emergency Department (HOSPITAL_COMMUNITY)
Admission: EM | Admit: 2016-08-03 | Discharge: 2016-08-03 | Disposition: A | Discharge status: Home / Self Care | Payer: Medicaid Other | Attending: Emergency Medicine | Admitting: Emergency Medicine

## 2016-08-03 ENCOUNTER — Encounter (HOSPITAL_COMMUNITY): Payer: Self-pay | Admitting: *Deleted

## 2016-08-03 DIAGNOSIS — R112 Nausea with vomiting, unspecified: Secondary | ICD-10-CM | POA: Diagnosis present

## 2016-08-03 DIAGNOSIS — F17200 Nicotine dependence, unspecified, uncomplicated: Secondary | ICD-10-CM | POA: Diagnosis not present

## 2016-08-03 DIAGNOSIS — R197 Diarrhea, unspecified: Secondary | ICD-10-CM | POA: Diagnosis not present

## 2016-08-03 MED ORDER — ONDANSETRON 4 MG PO TBDP
4.0000 mg | ORAL_TABLET | Freq: Three times a day (TID) | ORAL | 0 refills | Status: DC | PRN
Start: 2016-08-03 — End: 2016-09-08

## 2016-08-03 MED ORDER — MORPHINE SULFATE (PF) 4 MG/ML IV SOLN
4.0000 mg | Freq: Once | INTRAVENOUS | Status: DC
  Filled 2016-08-03: qty 1

## 2016-08-03 MED ORDER — ONDANSETRON HCL 4 MG/2ML IJ SOLN
4.0000 mg | Freq: Once | INTRAMUSCULAR | Status: AC
  Administered 2016-08-03: 4 mg via INTRAMUSCULAR

## 2016-08-03 MED ORDER — ONDANSETRON HCL 4 MG/2ML IJ SOLN
4.0000 mg | Freq: Once | INTRAMUSCULAR | Status: DC
  Filled 2016-08-03: qty 2

## 2016-08-03 MED ORDER — SODIUM CHLORIDE 0.9 % IV BOLUS (SEPSIS)
1000.0000 mL | Freq: Once | INTRAVENOUS | Status: AC
  Administered 2016-08-03: 1000 mL via INTRAVENOUS

## 2016-08-03 NOTE — ED Notes (Signed)
Bed: WA02 Expected date:  Expected time:  Means of arrival:  Comments: EMS- 40yo F, ab pain/n/v/d

## 2016-08-03 NOTE — ED Notes (Addendum)
Pt refused to have labs drawn. MD aware.

## 2016-08-03 NOTE — ED Notes (Signed)
The second is in the room attempting to start an IV and collect labs

## 2016-08-03 NOTE — ED Triage Notes (Signed)
Per EMS report: pt coming from home and presents with n/v since this morning.  Pt reported to EMS that she had been around people who were sick with similar symptoms. Pt reports a little bit of abd pain.

## 2016-08-03 NOTE — ED Notes (Signed)
Pt have been made of urine sample

## 2016-08-03 NOTE — ED Notes (Signed)
Attempted blood draw with no success.     

## 2016-08-03 NOTE — ED Notes (Signed)
Unsuccessful attempt at peripheral iv.  

## 2016-08-03 NOTE — ED Provider Notes (Signed)
[facility] DEPT Provider Note   CSN: [id number] Arrival date & time: 08/03/16  1027     History   Chief Complaint Chief Complaint  Patient presents with  . Nausea  . Emesis    HPI Terri Duran is a 40 y.o. female.  Pt presents to the ED today with n/v/d since 0530.  She said that she has been around people who have been sick.  She denies fevers, but feels cold.      Past Medical History:  Diagnosis Date  . BV (bacterial vaginosis)   . GERD (gastroesophageal reflux disease)     Patient Active Problem List   Diagnosis Date Noted  . Morbid obesity ([place]) 03/05/2015  . Family history of diabetes mellitus (DM) 11/21/2013  . High BMI 11/21/2013  . Smoker 11/21/2013    Past Surgical History:  Procedure Laterality Date  . DILATION AND CURETTAGE OF UTERUS    . TUBAL LIGATION      OB History    Gravida Para Term Preterm AB Living   3 2 2   1 2    SAB TAB Ectopic Multiple Live Births   1       2       Home Medications    Prior to Admission medications   Medication Sig Start Date End Date Taking? Authorizing Provider  cyclobenzaprine (FLEXERIL) 10 MG tablet Take 1 tablet (10 mg total) by mouth 2 (two) times daily as needed for muscle spasms. Patient not taking: Reported on 08/03/2016 06/04/16   [name], PA  furosemide (LASIX) 20 MG tablet Take 1 tablet (20 mg total) by mouth daily. Patient not taking: Reported on 08/03/2016 05/06/15   Robyn Haber, MD  ondansetron (ZOFRAN ODT) 4 MG disintegrating tablet Take 1 tablet (4 mg total) by mouth every 8 (eight) hours as needed for nausea or vomiting. 08/03/16   Isla Pence, MD  oxyCODONE-acetaminophen (PERCOCET) 10-325 MG tablet Take 1 tablet by mouth every 4 (four) hours as needed for pain. Patient not taking: Reported on 08/03/2016 06/04/16   [name], PA  predniSONE (DELTASONE) 20 MG tablet Take 2 tablets (40 mg total) by mouth daily. Take 40 mg by mouth daily for 3 days, then 20mg  by mouth daily for  3 days, then 10mg  daily for 3 days Patient not taking: Reported on 08/03/2016 07/02/16   [name], PA-C  varenicline (CHANTIX STARTING MONTH PAK) 0.5 MG X 11 & 1 MG X 42 tablet Take a 0.5mg  tablet 1X/day for 3 days, increase to twice a day for 4 days, then take 1 mg tablet twice a day. Patient not taking: Reported on 08/03/2016 03/05/15   [name], CNM    Family History Family History  Problem Relation Age of Onset  . Diabetes Mother   . Hypertension Mother   . Hyperlipidemia Mother     Social History Social History  Substance Use Topics  . Smoking status: Current Every Day Smoker    Packs/day: 0.50  . Smokeless tobacco: Never Used  . Alcohol use No     Allergies   Patient has no known allergies.   Review of Systems Review of Systems  Gastrointestinal: Positive for abdominal pain, diarrhea, nausea and vomiting.  All other systems reviewed and are negative.    Physical Exam Updated Vital Signs BP (!) 153/124 (BP Location: Right Arm)   Pulse 93   Temp 97.7 F (36.5 C) (Oral)   Resp 16   LMP 07/18/2016  SpO2 100%   Physical Exam  Constitutional: She is oriented to person, place, and time. She appears well-developed and well-nourished.  HENT:  Head: Normocephalic and atraumatic.  Right Ear: External ear normal.  Left Ear: External ear normal.  Nose: Nose normal.  Mouth/Throat: Oropharynx is clear and moist.  Eyes: Conjunctivae and EOM are normal. Pupils are equal, round, and reactive to light.  Neck: Normal range of motion. Neck supple.  Cardiovascular: Normal rate, regular rhythm, normal heart sounds and intact distal pulses.   Pulmonary/Chest: Effort normal and breath sounds normal.  Abdominal: Soft. Bowel sounds are normal. There is tenderness in the epigastric area.  Musculoskeletal: Normal range of motion.  Neurological: She is alert and oriented to person, place, and time.  Skin: Skin is warm.  Psychiatric: She has a normal mood and affect.  Her behavior is normal. Judgment and thought content normal.  Nursing note and vitals reviewed.    ED Treatments / Results  Labs (all labs ordered are listed, but only abnormal results are displayed) Labs Reviewed  COMPREHENSIVE METABOLIC PANEL  CBC WITH DIFFERENTIAL/PLATELET  LIPASE, BLOOD  URINALYSIS, ROUTINE W REFLEX MICROSCOPIC  PREGNANCY, URINE    EKG  EKG Interpretation None       Radiology No results found.  Procedures Procedures (including critical care time)  Medications Ordered in ED Medications  morphine 4 MG/ML injection 4 mg (4 mg Intravenous Refused 08/03/16 1314)  sodium chloride 0.9 % bolus 1,000 mL (1,000 mLs Intravenous New Bag/Given 08/03/16 1350)  ondansetron (ZOFRAN) injection 4 mg (4 mg Intramuscular Given 08/03/16 1315)     Initial Impression / Assessment and Plan / ED Course  I have reviewed the triage vital signs and the nursing notes.  Pertinent labs & imaging results that were available during my care of the patient were reviewed by me and considered in my medical decision making (see chart for details).  Clinical Course    Pt was a very difficult IV stick.  IV team finally able to get a line, but no blood.  Pt given 1L IV and zofran.  She is feeling much better and is able to tolerate po fluids.  She did not want to be stuck anymore, so she will be d/c without labs.  She knows to return if she worsens.  Final Clinical Impressions(s) / ED Diagnoses   Final diagnoses:  Nausea vomiting and diarrhea    New Prescriptions New Prescriptions   ONDANSETRON (ZOFRAN ODT) 4 MG DISINTEGRATING TABLET    Take 1 tablet (4 mg total) by mouth every 8 (eight) hours as needed for nausea or vomiting.     Isla Pence, MD 08/03/16 [phone]

## 2016-08-03 NOTE — ED Notes (Signed)
Nurse is in the room collecting the labs 

## 2016-09-08 ENCOUNTER — Ambulatory Visit (INDEPENDENT_AMBULATORY_CARE_PROVIDER_SITE_OTHER): Payer: Medicaid Other | Admitting: Obstetrics

## 2016-09-08 ENCOUNTER — Other Ambulatory Visit (HOSPITAL_COMMUNITY)
Admission: RE | Admit: 2016-09-08 | Discharge: 2016-09-08 | Disposition: A | Discharge status: Home / Self Care | Payer: Medicaid Other | Source: Ambulatory Visit | Attending: Obstetrics | Admitting: Obstetrics

## 2016-09-08 ENCOUNTER — Encounter: Payer: Self-pay | Admitting: Obstetrics

## 2016-09-08 VITALS — BP 148/87 | HR 86 | Ht 65.0 in | Wt 255.8 lb

## 2016-09-08 DIAGNOSIS — Z124 Encounter for screening for malignant neoplasm of cervix: Secondary | ICD-10-CM

## 2016-09-08 DIAGNOSIS — Z0000 Encounter for general adult medical examination without abnormal findings: Secondary | ICD-10-CM | POA: Diagnosis not present

## 2016-09-08 DIAGNOSIS — Z113 Encounter for screening for infections with a predominantly sexual mode of transmission: Secondary | ICD-10-CM | POA: Diagnosis present

## 2016-09-08 DIAGNOSIS — Z01419 Encounter for gynecological examination (general) (routine) without abnormal findings: Secondary | ICD-10-CM

## 2016-09-08 DIAGNOSIS — Z1151 Encounter for screening for human papillomavirus (HPV): Secondary | ICD-10-CM | POA: Diagnosis not present

## 2016-09-08 NOTE — Progress Notes (Signed)
Subjective:        Terri Duran is a 41 y.o. female here for a routine exam.  Current complaints: None.    Personal health questionnaire:  Is patient [name], have a family history of breast and/or ovarian cancer: yes, aunt Is there a family history of uterine cancer diagnosed at age < 57, gastrointestinal cancer, urinary tract cancer, family member who is a Field seismologist syndrome-associated carrier: no Is the patient overweight and hypertensive, family history of diabetes, personal history of gestational diabetes, preeclampsia or PCOS: no Is patient over 29, have PCOS,  family history of premature CHD under age 88, diabetes, smoke, have hypertension or peripheral artery disease:  no At any time, has a partner hit, kicked or otherwise hurt or frightened you?: no Over the past 2 weeks, have you felt down, depressed or hopeless?: no Over the past 2 weeks, have you felt little interest or pleasure in doing things?:no   Gynecologic History Patient's last menstrual period was 09/03/2016 (exact date). Contraception: tubal ligation Last Pap: 2016. Results were: normal Last mammogram: n/a. Results were: n/a  Obstetric History OB History  Gravida Para Term Preterm AB Living  3 2 2   1 2   SAB TAB Ectopic Multiple Live Births  1       2    # Outcome Date GA Lbr Len/2nd Weight Sex Delivery Anes PTL Lv  3 Term 08/14/02 [redacted]w[redacted]d  6 lb 5 oz (2.863 kg) F CS-LTranv EPI  LIV  2 Term 09/07/00 [redacted]w[redacted]d  6 lb 7 oz (2.92 kg) M CS-LTranv EPI  LIV  1 SAB 1998        DEC      Past Medical History:  Diagnosis Date  . BV (bacterial vaginosis)   . GERD (gastroesophageal reflux disease)     Past Surgical History:  Procedure Laterality Date  . DILATION AND CURETTAGE OF UTERUS    . TUBAL LIGATION      No current outpatient prescriptions on file. No Known Allergies  Social History  Substance Use Topics  . Smoking status: Current Every Day Smoker    Packs/day: 0.50  . Smokeless tobacco: Never  Used  . Alcohol use No     Comment: occassional    Family History  Problem Relation Age of Onset  . Diabetes Mother   . Hypertension Mother   . Hyperlipidemia Mother       Review of Systems  Constitutional: negative for fatigue and weight loss Respiratory: negative for cough and wheezing Cardiovascular: negative for chest pain, fatigue and palpitations Gastrointestinal: negative for abdominal pain and change in bowel habits Musculoskeletal:negative for myalgias Neurological: negative for gait problems and tremors Behavioral/Psych: negative for abusive relationship, depression Endocrine: negative for temperature intolerance    Genitourinary:negative for abnormal menstrual periods, genital lesions, hot flashes, sexual problems and vaginal discharge Integument/breast: negative for breast lump, breast tenderness, nipple discharge and skin lesion(s)    Objective:       BP (!) 148/87   Pulse 86   Ht 5\' 5"  (1.651 m)   Wt 255 lb 12.8 oz (116 kg)   LMP 09/03/2016 (Exact Date)   BMI 42.57 kg/m  General:   alert  Skin:   no rash or abnormalities  Lungs:   clear to auscultation bilaterally  Heart:   regular rate and rhythm, S1, S2 normal, no murmur, click, rub or gallop  Breasts:   normal without suspicious masses, skin or nipple changes or axillary nodes  Abdomen:  normal findings: no organomegaly, soft, non-tender and no hernia  Pelvis:  External genitalia: normal general appearance Urinary system: urethral meatus normal and bladder without fullness, nontender Vaginal: normal without tenderness, induration or masses Cervix: normal appearance Adnexa: normal bimanual exam Uterus: anteverted and non-tender, normal size   Lab Review Urine pregnancy test Labs reviewed yes Radiologic studies reviewed no  50% of 20 min visit spent on counseling and coordination of care.    Assessment:    Healthy female exam.    Plan:    Education reviewed: calcium supplements, low fat,  low cholesterol diet, safe sex/STD prevention, self breast exams and weight bearing exercise. Follow up in: 1 year.   No orders of the defined types were placed in this encounter.  No orders of the defined types were placed in this encounter.    Patient ID: Brand Males, female   DOB: [birth year], 41 y.o.   MRN: [id number]

## 2016-09-08 NOTE — Progress Notes (Signed)
Patient reports she has irregular cycle- she has had BTL.

## 2016-09-09 LAB — CYTOLOGY - PAP
Diagnosis: NEGATIVE
HPV (WINDOPATH): NOT DETECTED

## 2016-09-09 LAB — CERVICOVAGINAL ANCILLARY ONLY
BACTERIAL VAGINITIS: POSITIVE — AB
CANDIDA VAGINITIS: NEGATIVE
Chlamydia: NEGATIVE
NEISSERIA GONORRHEA: NEGATIVE
TRICH (WINDOWPATH): NEGATIVE

## 2016-09-10 ENCOUNTER — Other Ambulatory Visit: Payer: Self-pay | Admitting: Obstetrics

## 2016-09-10 DIAGNOSIS — N760 Acute vaginitis: Principal | ICD-10-CM

## 2016-09-10 DIAGNOSIS — B9689 Other specified bacterial agents as the cause of diseases classified elsewhere: Secondary | ICD-10-CM

## 2016-09-10 MED ORDER — TINIDAZOLE 500 MG PO TABS: 1000.0000 mg | ORAL_TABLET | Freq: Every day | ORAL | 2 refills | Status: AC

## 2016-11-04 ENCOUNTER — Encounter (HOSPITAL_COMMUNITY): Payer: Self-pay | Admitting: Emergency Medicine

## 2016-11-04 ENCOUNTER — Telehealth: Payer: Self-pay

## 2016-11-04 ENCOUNTER — Inpatient Hospital Stay (HOSPITAL_COMMUNITY)
Admission: AD | Admit: 2016-11-04 | Discharge: 2016-11-04 | Discharge status: Against Medical Advice | Payer: Medicaid Other | Source: Ambulatory Visit | Attending: Obstetrics and Gynecology | Admitting: Obstetrics and Gynecology

## 2016-11-04 ENCOUNTER — Emergency Department (HOSPITAL_COMMUNITY)
Admission: EM | Admit: 2016-11-04 | Discharge: 2016-11-04 | Disposition: A | Discharge status: Home / Self Care | Payer: Medicaid Other | Attending: Emergency Medicine | Admitting: Emergency Medicine

## 2016-11-04 ENCOUNTER — Ambulatory Visit (HOSPITAL_COMMUNITY)
Admission: EM | Admit: 2016-11-04 | Discharge: 2016-11-04 | Disposition: A | Discharge status: Home / Self Care | Payer: Medicaid Other | Attending: Internal Medicine | Admitting: Internal Medicine

## 2016-11-04 ENCOUNTER — Encounter (HOSPITAL_COMMUNITY): Payer: Self-pay

## 2016-11-04 DIAGNOSIS — N730 Acute parametritis and pelvic cellulitis: Secondary | ICD-10-CM

## 2016-11-04 DIAGNOSIS — B9689 Other specified bacterial agents as the cause of diseases classified elsewhere: Secondary | ICD-10-CM | POA: Diagnosis not present

## 2016-11-04 DIAGNOSIS — Z202 Contact with and (suspected) exposure to infections with a predominantly sexual mode of transmission: Secondary | ICD-10-CM | POA: Diagnosis not present

## 2016-11-04 DIAGNOSIS — F17200 Nicotine dependence, unspecified, uncomplicated: Secondary | ICD-10-CM | POA: Diagnosis not present

## 2016-11-04 DIAGNOSIS — Z3202 Encounter for pregnancy test, result negative: Secondary | ICD-10-CM | POA: Diagnosis not present

## 2016-11-04 DIAGNOSIS — N898 Other specified noninflammatory disorders of vagina: Secondary | ICD-10-CM

## 2016-11-04 DIAGNOSIS — N760 Acute vaginitis: Secondary | ICD-10-CM | POA: Insufficient documentation

## 2016-11-04 DIAGNOSIS — Z711 Person with feared health complaint in whom no diagnosis is made: Secondary | ICD-10-CM

## 2016-11-04 LAB — POCT URINALYSIS DIP (DEVICE)
BILIRUBIN URINE: NEGATIVE
Glucose, UA: NEGATIVE mg/dL
HGB URINE DIPSTICK: NEGATIVE
Ketones, ur: NEGATIVE mg/dL
LEUKOCYTES UA: NEGATIVE
NITRITE: NEGATIVE
Protein, ur: NEGATIVE mg/dL
Specific Gravity, Urine: 1.015 (ref 1.005–1.030)
Urobilinogen, UA: 0.2 mg/dL (ref 0.0–1.0)
pH: 7.5 (ref 5.0–8.0)

## 2016-11-04 LAB — POCT PREGNANCY, URINE: Preg Test, Ur: NEGATIVE

## 2016-11-04 MED ORDER — LIDOCAINE HCL (PF) 1 % IJ SOLN
INTRAMUSCULAR | Status: AC
  Filled 2016-11-04: qty 2

## 2016-11-04 MED ORDER — DOXYCYCLINE HYCLATE 100 MG PO CAPS: 100.0000 mg | ORAL_CAPSULE | Freq: Two times a day (BID) | ORAL | 0 refills | Status: DC

## 2016-11-04 MED ORDER — CEFTRIAXONE SODIUM 250 MG IJ SOLR
INTRAMUSCULAR | Status: AC
  Filled 2016-11-04: qty 250

## 2016-11-04 MED ORDER — CEFTRIAXONE SODIUM 250 MG IJ SOLR
250.0000 mg | Freq: Once | INTRAMUSCULAR | Status: AC
  Administered 2016-11-04: 250 mg via INTRAMUSCULAR

## 2016-11-04 MED ORDER — METRONIDAZOLE 500 MG PO TABS: 500.0000 mg | ORAL_TABLET | Freq: Two times a day (BID) | ORAL | 0 refills | Status: DC

## 2016-11-04 MED ORDER — AZITHROMYCIN 250 MG PO TABS
ORAL_TABLET | ORAL | Status: AC
  Filled 2016-11-04: qty 4

## 2016-11-04 MED ORDER — AZITHROMYCIN 250 MG PO TABS
1000.0000 mg | ORAL_TABLET | Freq: Once | ORAL | Status: AC
  Administered 2016-11-04: 1000 mg via ORAL

## 2016-11-04 NOTE — ED Provider Notes (Signed)
CSN: [id number]     Arrival date & time 11/04/16  1038 History   First MD Initiated Contact with Patient 11/04/16 1150     Chief Complaint  Patient presents with  . Vaginal Discharge   (Consider location/radiation/quality/duration/timing/severity/associated sxs/prior Treatment) Patient c/o vaginal discharge and has had unprotected sex.  She states she is concerned because her partner informed her that he may have an STD.   The history is provided by the patient.  Vaginal Discharge  Quality:  Watery Severity:  Mild Onset quality:  Sudden Duration:  2 days Timing:  Constant Progression:  Worsening Chronicity:  New Relieved by:  Nothing Worsened by:  Nothing Ineffective treatments:  None tried   Past Medical History:  Diagnosis Date  . BV (bacterial vaginosis)   . GERD (gastroesophageal reflux disease)    Past Surgical History:  Procedure Laterality Date  . DILATION AND CURETTAGE OF UTERUS    . TUBAL LIGATION     Family History  Problem Relation Age of Onset  . Diabetes Mother   . Hypertension Mother   . Hyperlipidemia Mother    Social History  Substance Use Topics  . Smoking status: Current Every Day Smoker    Packs/day: 0.50  . Smokeless tobacco: Never Used  . Alcohol use No     Comment: occassional   OB History    Gravida Para Term Preterm AB Living   3 2 2   1 2    SAB TAB Ectopic Multiple Live Births   1       2     Review of Systems  Constitutional: Negative.   HENT: Negative.   Eyes: Negative.   Respiratory: Negative.   Gastrointestinal: Negative.   Endocrine: Negative.   Genitourinary: Positive for vaginal discharge.  Musculoskeletal: Negative.   Skin: Negative.   Allergic/Immunologic: Negative.   Neurological: Negative.   Hematological: Negative.   Psychiatric/Behavioral: Negative.     Allergies  Patient has no known allergies.  Home Medications   Prior to Admission medications   Medication Sig Start Date End Date Taking? Authorizing  Provider  doxycycline (VIBRAMYCIN) 100 MG capsule Take 1 capsule (100 mg total) by mouth 2 (two) times daily. 11/04/16   Lysbeth Penner, FNP  metroNIDAZOLE (FLAGYL) 500 MG tablet Take 1 tablet (500 mg total) by mouth 2 (two) times daily. 11/04/16   Lysbeth Penner, FNP   Meds Ordered and Administered this Visit   Medications  cefTRIAXone (ROCEPHIN) injection 250 mg (250 mg Intramuscular Given 11/04/16 1153)  azithromycin (ZITHROMAX) tablet 1,000 mg (1,000 mg Oral Given 11/04/16 1152)    BP 133/89 (BP Location: Right Arm)   Pulse 76   Temp 97.1 F (36.2 C) (Oral)   LMP 10/28/2016 (Approximate)   SpO2 96%  No data found.   Physical Exam  Constitutional: She appears well-developed and well-nourished.  HENT:  Head: Normocephalic and atraumatic.  Eyes: Conjunctivae and EOM are normal. Pupils are equal, round, and reactive to light.  Neck: Normal range of motion. Neck supple.  Cardiovascular: Normal rate, regular rhythm and normal heart sounds.   Pulmonary/Chest: Effort normal and breath sounds normal.  Genitourinary: Vaginal discharge found.  Genitourinary Comments: BUS wnl Vagina with yellow green discharge Cervix - Tender Adnexa - non tender  Musculoskeletal: Normal range of motion.  Nursing note and vitals reviewed.   Urgent Care Course     Procedures (including critical care time)  Labs Review Labs Reviewed  POCT URINALYSIS DIP (DEVICE)  POCT PREGNANCY, URINE  CERVICOVAGINAL ANCILLARY ONLY    Imaging Review No results found.   Visual Acuity Review  Right Eye Distance:   Left Eye Distance:   Bilateral Distance:    Right Eye Near:   Left Eye Near:    Bilateral Near:         MDM   1. Vaginal discharge   2. Concern about STD in female without diagnosis   3. PID (acute pelvic inflammatory disease)    Flagyl 500mg  bid x 14 days #28 Doxycycline 100mg  po bid x 14 days Rocephin 250mg  IM  UA cytology GC/ Chlamydia BD affirm       Lysbeth Penner,  FNP 11/04/16 1300

## 2016-11-04 NOTE — ED Triage Notes (Signed)
Patient reports that a sexual partner was exposed to an STD. Patient states no vaginal discharge or itching.

## 2016-11-04 NOTE — MAU Note (Signed)
Pt Presents to MAU after being exposed to an STD. Pt states she does not want to wait, and will come back later. Discussed other clinic options she could be seen at, as the health dept.

## 2016-11-04 NOTE — ED Notes (Signed)
Pt does not want to have blood drawn. Pt stated that she would only like to be tested with the swabs. Pt stated that she had her blood drawn a couple of days ago and does not need it today.

## 2016-11-04 NOTE — ED Triage Notes (Signed)
Pt reports clear vaginal discharge with no odor.  She denies any pain, itching or any other urinary issues.  She reports her partner says he tested positive for "NGU"  Pt would like to be tested.

## 2016-11-04 NOTE — Telephone Encounter (Signed)
Patient called in for std results from Jan because she is at the hospital and states that she was told she may have an std. Advised patient of results and how to view on mychart.

## 2016-11-04 NOTE — ED Provider Notes (Signed)
[facility] DEPT Provider Note   CSN: [id number] Arrival date & time: 11/04/16  0750     History   Chief Complaint Chief Complaint  Patient presents with  . Exposure to STD    HPI Terri Duran is a 41 y.o. female.  The history is provided by the patient.  Exposure to STD  This is a new problem. The current episode started more than 1 week ago. The problem has not changed since onset.Pertinent negatives include no chest pain, no abdominal pain, no headaches and no shortness of breath. Nothing aggravates the symptoms. Nothing relieves the symptoms. She has tried nothing for the symptoms.   Reports that her boyfriend told her that he was exposed to something that had "GUN" in it and it was "bacteria that goes with chlamydia," but "not Gonorrhea."  Denies any abd/pelvic pain, vaginal bleeding or discharge.   Past Medical History:  Diagnosis Date  . BV (bacterial vaginosis)   . GERD (gastroesophageal reflux disease)     Patient Active Problem List   Diagnosis Date Noted  . Morbid obesity ([place]) 03/05/2015  . Family history of diabetes mellitus (DM) 11/21/2013  . High BMI 11/21/2013  . Smoker 11/21/2013    Past Surgical History:  Procedure Laterality Date  . DILATION AND CURETTAGE OF UTERUS    . TUBAL LIGATION      OB History    Gravida Para Term Preterm AB Living   3 2 2   1 2    SAB TAB Ectopic Multiple Live Births   1       2       Home Medications    Prior to Admission medications   Not on File    Family History Family History  Problem Relation Age of Onset  . Diabetes Mother   . Hypertension Mother   . Hyperlipidemia Mother     Social History Social History  Substance Use Topics  . Smoking status: Current Every Day Smoker    Packs/day: 0.50  . Smokeless tobacco: Never Used  . Alcohol use No     Comment: occassional     Allergies   Patient has no known allergies.   Review of Systems Review of Systems  Respiratory: Negative for  shortness of breath.   Cardiovascular: Negative for chest pain.  Gastrointestinal: Negative for abdominal pain.  Neurological: Negative for headaches.     Physical Exam Updated Vital Signs BP 133/82 (BP Location: Left Arm)   Pulse 84   Temp 97.4 F (36.3 C) (Oral)   Ht 5\' 5"  (1.651 m)   Wt 255 lb (115.7 kg)   LMP 10/28/2016 (Approximate)   SpO2 100%   BMI 42.43 kg/m   Physical Exam  Constitutional: She is oriented to person, place, and time. She appears well-developed and well-nourished. No distress.  obese  HENT:  Head: Normocephalic and atraumatic.  Nose: Nose normal.  Eyes: Conjunctivae and EOM are normal. Pupils are equal, round, and reactive to light. Right eye exhibits no discharge. Left eye exhibits no discharge. No scleral icterus.  Neck: Normal range of motion. Neck supple.  Cardiovascular: Normal rate and regular rhythm.  Exam reveals no gallop and no friction rub.   No murmur heard. Pulmonary/Chest: Effort normal and breath sounds normal. No stridor. No respiratory distress. She has no rales.  Abdominal: Soft. She exhibits no distension. There is no tenderness.  Musculoskeletal: She exhibits no edema or tenderness.  Neurological: She is alert and oriented to person, place, and  time.  Skin: Skin is warm and dry. No rash noted. She is not diaphoretic. No erythema.  Psychiatric: She has a normal mood and affect.  Vitals reviewed.    ED Treatments / Results  Labs (all labs ordered are listed, but only abnormal results are displayed) Labs Reviewed  WET PREP, GENITAL  GC/CHLAMYDIA PROBE AMP ([place]) NOT AT [facility]    EKG  EKG Interpretation None       Radiology No results found.  Procedures Procedures (including critical care time)  Medications Ordered in ED Medications - No data to display   Initial Impression / Assessment and Plan / ED Course  I have reviewed the triage vital signs and the nursing notes.  Pertinent labs & imaging results  that were available during my care of the patient were reviewed by me and considered in my medical decision making (see chart for details).     Possible STD exposure w/o symptoms. Will perform blind sweep. No need for pelvic exam at this time given the lack of symptoms or pelvic pain that would suggest PID.  Offered HIV and RPR testing, pt declined.   9:47 AM Called in to the patient's room. She is stating that no one had mentioned performing blind sweep and that "she is never heard of this" and no one "told" her about this. I informed her that I had mentioned getting this during the initial encounter. She stated that she wanted to leave and go somewhere else.  The patient is safe for discharge with strict return precautions.    Final Clinical Impressions(s) / ED Diagnoses   Final diagnoses:  Possible exposure to STD   Disposition: Discharge  Condition: Good  I have discussed the results, Dx and Tx plan with the patient who expressed understanding and agree(s) with the plan. Discharge instructions discussed at great length.  No further questions at time of discharge.    New Prescriptions   No medications on file    Follow Up: Nolene Ebbs, MD [place] [address] [phone]  Schedule an appointment as soon as possible for a visit  As needed      Fatima Blank, MD 11/04/16 [phone]

## 2016-11-05 LAB — CERVICOVAGINAL ANCILLARY ONLY: Wet Prep (BD Affirm): POSITIVE — AB

## 2016-11-08 LAB — CERVICOVAGINAL ANCILLARY ONLY
Chlamydia: NEGATIVE
Neisseria Gonorrhea: NEGATIVE

## 2016-11-11 ENCOUNTER — Encounter (HOSPITAL_COMMUNITY): Payer: Self-pay | Admitting: *Deleted

## 2016-11-11 ENCOUNTER — Inpatient Hospital Stay (HOSPITAL_COMMUNITY)
Admission: AD | Admit: 2016-11-11 | Discharge: 2016-11-12 | Disposition: A | Discharge status: Home / Self Care | Payer: Medicaid Other | Source: Ambulatory Visit | Attending: Obstetrics and Gynecology | Admitting: Obstetrics and Gynecology

## 2016-11-11 DIAGNOSIS — F17210 Nicotine dependence, cigarettes, uncomplicated: Secondary | ICD-10-CM | POA: Diagnosis not present

## 2016-11-11 DIAGNOSIS — N939 Abnormal uterine and vaginal bleeding, unspecified: Secondary | ICD-10-CM | POA: Diagnosis present

## 2016-11-11 DIAGNOSIS — N760 Acute vaginitis: Secondary | ICD-10-CM | POA: Diagnosis not present

## 2016-11-11 DIAGNOSIS — K219 Gastro-esophageal reflux disease without esophagitis: Secondary | ICD-10-CM | POA: Diagnosis not present

## 2016-11-11 LAB — URINALYSIS, ROUTINE W REFLEX MICROSCOPIC
Bacteria, UA: NONE SEEN
Bilirubin Urine: NEGATIVE
GLUCOSE, UA: NEGATIVE mg/dL
KETONES UR: NEGATIVE mg/dL
LEUKOCYTES UA: NEGATIVE
NITRITE: NEGATIVE
PH: 6 (ref 5.0–8.0)
Protein, ur: NEGATIVE mg/dL
Specific Gravity, Urine: 1.03 (ref 1.005–1.030)

## 2016-11-11 LAB — POCT PREGNANCY, URINE: Preg Test, Ur: NEGATIVE

## 2016-11-11 NOTE — MAU Note (Signed)
PT  SAYS X3 [facility]  - HER  CYCLE  COMES  2X / MTH.        HAS NOT  CALLED  OFFICE  NO BIRTH CONTROL-  HAD  BTL-   2003.  LAST SEX-   2 WEEKS  AGO

## 2016-11-11 NOTE — MAU Provider Note (Signed)
History     CSN: [id number]  Arrival date and time: 11/11/16 2235   First Provider Initiated Contact with Patient 11/11/16 2355      Chief Complaint  Patient presents with  . Vaginal Bleeding   Terri Duran is a 41 y.o. [id number] who presents today with irregular menstrual cycles. She states that she a period on 2/27 and then she started bleeding on 11/09/16. She states that she can't remember the dates of any of her other periods, but "it feels like I have 2 per month". She states that the flow that one will be heavy with clots about the size of a ping pong ball, and then one will be lighter than that. She states that she sees Dr. Jodi Mourning. She called, but was unable to make an appointment.    Vaginal Bleeding  The patient's primary symptoms include pelvic pain and vaginal bleeding. This is a new problem. The current episode started more than 1 month ago. The problem occurs intermittently. The problem has been unchanged. Pain severity now: 3/10  She is not pregnant. Pertinent negatives include no chills, dysuria, fever, frequency, nausea, urgency or vomiting. The vaginal bleeding is typical of menses. She has been passing clots. She has not been passing tissue. Nothing aggravates the symptoms. She has tried nothing for the symptoms. She uses tubal ligation for contraception.    Past Medical History:  Diagnosis Date  . BV (bacterial vaginosis)   . GERD (gastroesophageal reflux disease)     Past Surgical History:  Procedure Laterality Date  . DILATION AND CURETTAGE OF UTERUS    . TUBAL LIGATION      Family History  Problem Relation Age of Onset  . Diabetes Mother   . Hypertension Mother   . Hyperlipidemia Mother     Social History  Substance Use Topics  . Smoking status: Current Every Day Smoker    Packs/day: 0.50  . Smokeless tobacco: Never Used  . Alcohol use No     Comment: occassional    Allergies: No Known Allergies  Prescriptions Prior to Admission  Medication  Sig Dispense Refill Last Dose  . doxycycline (VIBRAMYCIN) 100 MG capsule Take 1 capsule (100 mg total) by mouth 2 (two) times daily. 28 capsule 0   . metroNIDAZOLE (FLAGYL) 500 MG tablet Take 1 tablet (500 mg total) by mouth 2 (two) times daily. 28 tablet 0     Review of Systems  Constitutional: Negative for chills and fever.  Gastrointestinal: Negative for nausea and vomiting.  Genitourinary: Positive for pelvic pain and vaginal bleeding. Negative for dysuria, frequency and urgency.   Physical Exam   Blood pressure 125/79, pulse 76, temperature 97.5 F (36.4 C), temperature source Oral, resp. rate 20, height 5\' 5"  (1.651 m), weight 249 lb 12 oz (113.3 kg), last menstrual period 10/28/2016.  Physical Exam  Nursing note and vitals reviewed. Constitutional: She is oriented to person, place, and time. She appears well-developed and well-nourished. No distress.  HENT:  Head: Normocephalic.  Cardiovascular: Normal rate.   Respiratory: Effort normal.  GI: Soft. There is no tenderness. There is no rebound.  Neurological: She is alert and oriented to person, place, and time.  Skin: Skin is warm and dry.  Psychiatric: She has a normal mood and affect.     Results for orders placed or performed during the hospital encounter of 11/11/16 (from the past 24 hour(s))  Urinalysis, Routine w reflex microscopic     Status: Abnormal   Collection Time: 11/11/16  10:51 PM  Result Value Ref Range   Color, Urine YELLOW YELLOW   APPearance CLEAR CLEAR   Specific Gravity, Urine 1.030 1.005 - 1.030   pH 6.0 5.0 - 8.0   Glucose, UA NEGATIVE NEGATIVE mg/dL   Hgb urine dipstick SMALL (A) NEGATIVE   Bilirubin Urine NEGATIVE NEGATIVE   Ketones, ur NEGATIVE NEGATIVE mg/dL   Protein, ur NEGATIVE NEGATIVE mg/dL   Nitrite NEGATIVE NEGATIVE   Leukocytes, UA NEGATIVE NEGATIVE   RBC / HPF 6-30 0 - 5 RBC/hpf   WBC, UA 0-5 0 - 5 WBC/hpf   Bacteria, UA NONE SEEN NONE SEEN   Squamous Epithelial / LPF 0-5 (A)  NONE SEEN   Mucous PRESENT   Pregnancy, urine POC     Status: None   Collection Time: 11/11/16 11:02 PM  Result Value Ref Range   Preg Test, Ur NEGATIVE NEGATIVE  CBC     Status: Abnormal   Collection Time: 11/12/16 12:12 AM  Result Value Ref Range   WBC 6.4 4.0 - 10.5 K/uL   RBC 3.82 (L) 3.87 - 5.11 MIL/uL   Hemoglobin 10.2 (L) 12.0 - 15.0 g/dL   HCT 31.9 (L) 36.0 - 46.0 %   MCV 83.5 78.0 - 100.0 fL   MCH 26.7 26.0 - 34.0 pg   MCHC 32.0 30.0 - 36.0 g/dL   RDW 16.9 (H) 11.5 - 15.5 %   Platelets 268 150 - 400 K/uL   MAU Course  Procedures  MDM   Assessment and Plan   1. Abnormal uterine bleeding (AUB)    DC home Comfort measures reviewed  Bleeding precautions RX: none  Return to MAU as needed FU with OB as planned  Follow-up Information    HARPER,CHARLES A, MD Follow up.   Specialty:  Obstetrics and Gynecology Contact information: [address] [place] [id number] [phone]            Mathis Bud 11/11/2016, 11:56 PM

## 2016-11-12 DIAGNOSIS — N939 Abnormal uterine and vaginal bleeding, unspecified: Secondary | ICD-10-CM | POA: Diagnosis not present

## 2016-11-12 LAB — CBC
HCT: 31.9 % — ABNORMAL LOW (ref 36.0–46.0)
Hemoglobin: 10.2 g/dL — ABNORMAL LOW (ref 12.0–15.0)
MCH: 26.7 pg (ref 26.0–34.0)
MCHC: 32 g/dL (ref 30.0–36.0)
MCV: 83.5 fL (ref 78.0–100.0)
Platelets: 268 10*3/uL (ref 150–400)
RBC: 3.82 MIL/uL — AB (ref 3.87–5.11)
RDW: 16.9 % — ABNORMAL HIGH (ref 11.5–15.5)
WBC: 6.4 10*3/uL (ref 4.0–10.5)

## 2016-11-12 MED ORDER — IBUPROFEN 800 MG PO TABS
800.0000 mg | ORAL_TABLET | Freq: Once | ORAL | Status: AC
  Administered 2016-11-12: 800 mg via ORAL
  Filled 2016-11-12: qty 1

## 2016-11-12 NOTE — Discharge Instructions (Signed)

## 2016-11-25 ENCOUNTER — Ambulatory Visit (HOSPITAL_COMMUNITY)
Admission: EM | Admit: 2016-11-25 | Discharge: 2016-11-25 | Disposition: A | Discharge status: Home / Self Care | Payer: Medicaid Other | Attending: Internal Medicine | Admitting: Internal Medicine

## 2016-11-25 ENCOUNTER — Encounter (HOSPITAL_COMMUNITY): Payer: Self-pay | Admitting: Family Medicine

## 2016-11-25 DIAGNOSIS — G44209 Tension-type headache, unspecified, not intractable: Secondary | ICD-10-CM

## 2016-11-25 MED ORDER — DICLOFENAC SODIUM 75 MG PO TBEC
75.0000 mg | DELAYED_RELEASE_TABLET | Freq: Two times a day (BID) | ORAL | 0 refills | Status: DC
Start: 2016-11-25 — End: 2017-10-26

## 2016-11-25 MED ORDER — DEXAMETHASONE 6 MG PO TABS: 6.0000 mg | ORAL_TABLET | Freq: Two times a day (BID) | ORAL | 0 refills | Status: DC

## 2016-11-25 NOTE — ED Provider Notes (Signed)
CSN: [id number]     Arrival date & time 11/25/16  1749 History   First MD Initiated Contact with Patient 11/25/16 1846     Chief Complaint  Patient presents with  . Headache   (Consider location/radiation/quality/duration/timing/severity/associated sxs/prior Treatment) 41 year old female presents to clinic for evaluation of headache started approximately 5:00 this morning. She is complaining of light sensitivity, denies any sensitivity to sound, no nausea, vomiting, diarrhea, no weakness, no unilateral numbness or tingling. She describes it as a throbbing pain centered at the base of her neck. She has had a history of headaches before, and has had similar pains.   The history is provided by the patient.  Headache  Pain location:  Occipital Quality:  Dull Radiates to:  Does not radiate Severity currently:  8/10 Severity at highest:  8/10 Onset quality:  Gradual Duration:  13 hours Timing:  Constant Progression:  Unchanged Chronicity:  New Similar to prior headaches: yes   Context: bright light and caffeine   Context: not coughing and not loud noise   Relieved by:  None tried Worsened by:  Nothing Ineffective treatments:  None tried Associated symptoms: no back pain, no blurred vision, no cough, no diarrhea, no dizziness, no ear pain, no eye pain, no fatigue, no fever, no focal weakness, no myalgias, no nausea, no near-syncope, no neck pain, no neck stiffness, no numbness, no paresthesias, no photophobia, no sinus pressure, no sore throat, no syncope, no URI, no visual change, no vomiting and no weakness     Past Medical History:  Diagnosis Date  . BV (bacterial vaginosis)   . GERD (gastroesophageal reflux disease)    Past Surgical History:  Procedure Laterality Date  . DILATION AND CURETTAGE OF UTERUS    . TUBAL LIGATION     Family History  Problem Relation Age of Onset  . Diabetes Mother   . Hypertension Mother   . Hyperlipidemia Mother    Social History  Substance  Use Topics  . Smoking status: Current Every Day Smoker    Packs/day: 0.50  . Smokeless tobacco: Never Used  . Alcohol use No     Comment: occassional   OB History    Gravida Para Term Preterm AB Living   3 2 2   1 2    SAB TAB Ectopic Multiple Live Births   1       2     Review of Systems  Constitutional: Negative for fatigue and fever.  HENT: Negative for ear pain, sinus pressure and sore throat.   Eyes: Negative for blurred vision, photophobia and pain.  Respiratory: Negative for cough.   Cardiovascular: Negative for syncope and near-syncope.  Gastrointestinal: Negative for diarrhea, nausea and vomiting.  Musculoskeletal: Negative for back pain, myalgias, neck pain and neck stiffness.  Neurological: Positive for headaches. Negative for dizziness, focal weakness, weakness, numbness and paresthesias.  All other systems reviewed and are negative.   Allergies  Patient has no known allergies.  Home Medications   Prior to Admission medications   Medication Sig Start Date End Date Taking? Authorizing Provider  dexamethasone (DECADRON) 6 MG tablet Take 1 tablet (6 mg total) by mouth 2 (two) times daily. 11/25/16   Barnet Glasgow, NP  diclofenac (VOLTAREN) 75 MG EC tablet Take 1 tablet (75 mg total) by mouth 2 (two) times daily. 11/25/16   Barnet Glasgow, NP   Meds Ordered and Administered this Visit  Medications - No data to display  BP 134/79 (BP Location: Left Arm)  Pulse 84   Temp 98.4 F (36.9 C) (Oral)   Resp 16   LMP 10/28/2016 (Approximate)   SpO2 100%  No data found.   Physical Exam  Constitutional: She is oriented to person, place, and time. She appears well-developed and well-nourished. No distress.  HENT:  Head: Normocephalic and atraumatic.  Right Ear: External ear normal.  Left Ear: External ear normal.  Eyes: EOM are normal. Pupils are equal, round, and reactive to light.  Neck: Normal range of motion. Neck supple.  Cardiovascular: Normal rate and  regular rhythm.   Pulmonary/Chest: Effort normal and breath sounds normal.  Neurological: She is alert and oriented to person, place, and time.  Skin: Skin is warm and dry. Capillary refill takes less than 2 seconds. No rash noted. She is not diaphoretic. No erythema.  Psychiatric: She has a normal mood and affect. Her behavior is normal.  Nursing note and vitals reviewed.   Urgent Care Course     Procedures (including critical care time)  Labs Review Labs Reviewed - No data to display  Imaging Review No results found.         MDM   1. Acute non intractable tension-type headache    Patient was offered injection of Toradol and dexamethasone in clinic, however she declined. Prescription sent for diclofenac Methasone to her pharmacy, work note provided. Index of suspicion for serious pathology is low, no red flag warning signs present.     Barnet Glasgow, NP 11/25/16 1902

## 2016-11-25 NOTE — ED Triage Notes (Signed)
Pt here for tension in neck and back of head. Denies any N,V,D. Denies photophobia. sts it makes her eyes water. sts started  This am and she took 2 ibuprofen and no relief.

## 2016-11-25 NOTE — Discharge Instructions (Signed)
For your headache, I prescribed a medicine called diclofenac, take one tablet twice a day as needed. If you headaches persist, follow-up with a primary care provider. I also recommend avoiding caffeinated products, he may also take over-the-counter Tylenol as well.

## 2017-02-01 ENCOUNTER — Encounter (HOSPITAL_COMMUNITY): Payer: Self-pay

## 2017-02-01 ENCOUNTER — Emergency Department (HOSPITAL_COMMUNITY)
Admission: EM | Admit: 2017-02-01 | Discharge: 2017-02-01 | Disposition: A | Discharge status: Home / Self Care | Payer: Medicaid Other | Attending: Emergency Medicine | Admitting: Emergency Medicine

## 2017-02-01 DIAGNOSIS — F17200 Nicotine dependence, unspecified, uncomplicated: Secondary | ICD-10-CM | POA: Insufficient documentation

## 2017-02-01 DIAGNOSIS — X500XXA Overexertion from strenuous movement or load, initial encounter: Secondary | ICD-10-CM | POA: Diagnosis not present

## 2017-02-01 DIAGNOSIS — F419 Anxiety disorder, unspecified: Secondary | ICD-10-CM | POA: Insufficient documentation

## 2017-02-01 DIAGNOSIS — S3992XA Unspecified injury of lower back, initial encounter: Secondary | ICD-10-CM | POA: Diagnosis present

## 2017-02-01 DIAGNOSIS — Y939 Activity, unspecified: Secondary | ICD-10-CM | POA: Diagnosis not present

## 2017-02-01 DIAGNOSIS — S39012A Strain of muscle, fascia and tendon of lower back, initial encounter: Secondary | ICD-10-CM | POA: Diagnosis not present

## 2017-02-01 DIAGNOSIS — Y929 Unspecified place or not applicable: Secondary | ICD-10-CM | POA: Diagnosis not present

## 2017-02-01 DIAGNOSIS — Z79899 Other long term (current) drug therapy: Secondary | ICD-10-CM | POA: Diagnosis not present

## 2017-02-01 DIAGNOSIS — Y999 Unspecified external cause status: Secondary | ICD-10-CM | POA: Insufficient documentation

## 2017-02-01 DIAGNOSIS — T148XXA Other injury of unspecified body region, initial encounter: Secondary | ICD-10-CM

## 2017-02-01 LAB — POC URINE PREG, ED: Preg Test, Ur: NEGATIVE

## 2017-02-01 MED ORDER — IBUPROFEN 600 MG PO TABS: 600.0000 mg | ORAL_TABLET | Freq: Four times a day (QID) | ORAL | 0 refills | Status: DC | PRN

## 2017-02-01 MED ORDER — CYCLOBENZAPRINE HCL 10 MG PO TABS: 10.0000 mg | ORAL_TABLET | Freq: Two times a day (BID) | ORAL | 0 refills | Status: DC | PRN

## 2017-02-01 MED ORDER — KETOROLAC TROMETHAMINE 60 MG/2ML IM SOLN
60.0000 mg | Freq: Once | INTRAMUSCULAR | Status: AC
  Administered 2017-02-01: 60 mg via INTRAMUSCULAR
  Filled 2017-02-01: qty 2

## 2017-02-01 NOTE — ED Provider Notes (Signed)
[facility] DEPT Provider Note   CSN: [id number] Arrival date & time: 02/01/17  1158     History   Chief Complaint Chief Complaint  Patient presents with  . Back Pain  . Panic Attack    HPI Terri Duran is a 41 y.o. female who presents with lower back pain that began this morning. Patient states that she had squatted down and attempted to lift a heavy box when the back pain started. She has been able to ambulate since the incident but reports that her pain is worsened with ambulation and movement. She has not tried any medications prior to ED arrival. Patient states that the back pain does not radiate. She denies any weakness/numbness of her arms or legs, swelling of her legs. Denies fevers, weight loss, numbness/weakness of upper and lower extremities, bowel/bladder incontinence, saddle anesthesia, history of back surgery, history of IVDA.   Patient also reports that she has recently been experiencing intermittent panic attacks. Patient states that she has recently experienced a lot of stressors that began a few weeks ago. Patient states that she had to move out of her house and moved in with a friend, which has caused patient to have a lot of anxiety. Additionally patient reports that she has had some financial stressors that have contributed to her anxiety. Patient states that she has about 2 episodes of anxiety attacks a day. She states that she starts thinking about everything that is going on now and starts to feel very overwhelmed and experiences some difficulty breathing and some numbness/tingling in her bilateral arms. Patient states that these episodes last for about a minute before resolving on their own. She has not been evaluated for these anxiety attacks. She states that shortness of breath is not exertional or pleuritic. She does not have associated chest pain, diaphoresis, nausea with these episodes of shortness of breath. She denies any SI/HI.   The history is provided by  the patient.    Past Medical History:  Diagnosis Date  . BV (bacterial vaginosis)   . GERD (gastroesophageal reflux disease)     Patient Active Problem List   Diagnosis Date Noted  . Morbid obesity ([place]) 03/05/2015  . Family history of diabetes mellitus (DM) 11/21/2013  . High BMI 11/21/2013  . Smoker 11/21/2013    Past Surgical History:  Procedure Laterality Date  . DILATION AND CURETTAGE OF UTERUS    . TUBAL LIGATION      OB History    Gravida Para Term Preterm AB Living   3 2 2   1 2    SAB TAB Ectopic Multiple Live Births   1       2       Home Medications    Prior to Admission medications   Medication Sig Start Date End Date Taking? Authorizing Provider  cyclobenzaprine (FLEXERIL) 10 MG tablet Take 1 tablet (10 mg total) by mouth 2 (two) times daily as needed for muscle spasms. 02/01/17   Volanda Napoleon, PA-C  dexamethasone (DECADRON) 6 MG tablet Take 1 tablet (6 mg total) by mouth 2 (two) times daily. Patient not taking: Reported on 02/01/2017 11/25/16   Barnet Glasgow, NP  diclofenac (VOLTAREN) 75 MG EC tablet Take 1 tablet (75 mg total) by mouth 2 (two) times daily. Patient not taking: Reported on 02/01/2017 11/25/16   Barnet Glasgow, NP  ibuprofen (ADVIL,MOTRIN) 600 MG tablet Take 1 tablet (600 mg total) by mouth every 6 (six) hours as needed. 02/01/17   Evette Cristal,  Tyna Jaksch PA-C    Family History Family History  Problem Relation Age of Onset  . Diabetes Mother   . Hypertension Mother   . Hyperlipidemia Mother     Social History Social History  Substance Use Topics  . Smoking status: Current Every Day Smoker    Packs/day: 0.50  . Smokeless tobacco: Never Used  . Alcohol use No     Comment: occassional     Allergies   Patient has no known allergies.   Review of Systems Review of Systems  Constitutional: Negative for diaphoresis and fever.  Respiratory: Positive for shortness of breath (With anxiety).   Cardiovascular: Negative for chest pain  and leg swelling.  Gastrointestinal: Negative for abdominal pain, nausea and vomiting.  Musculoskeletal: Positive for back pain.  Neurological: Negative for headaches.  Psychiatric/Behavioral: The patient is nervous/anxious.      Physical Exam Updated Vital Signs BP 129/71 (BP Location: Left Arm)   Pulse 87   Temp 98.2 F (36.8 C) (Oral)   Resp 16   Ht 5\' 4"  (1.626 m)   Wt 112.9 kg (249 lb)   LMP 01/11/2017 (Approximate)   SpO2 100%   BMI 42.74 kg/m   Physical Exam  Constitutional: She appears well-developed and well-nourished.  Sitting comfortably on examination table  HENT:  Head: Normocephalic and atraumatic.  Eyes: Conjunctivae and EOM are normal. Right eye exhibits no discharge. Left eye exhibits no discharge. No scleral icterus.  Neck: Normal range of motion and full passive range of motion without pain. No spinous process tenderness and no muscular tenderness present.  Full flexion/extension and lateral movement of neck intact without difficulty.  Cardiovascular: Normal rate and regular rhythm.   Pulses:      Radial pulses are 2+ on the right side, and 2+ on the left side.       Dorsalis pedis pulses are 2+ on the right side, and 2+ on the left side.  Pulmonary/Chest: Effort normal.  Musculoskeletal:       Cervical back: She exhibits no tenderness.       Thoracic back: She exhibits no tenderness.  Diffuse muscular tenderness overlying the lumbar region, right greater than left. No midline bony tenderness. No crepitus or deformities or step-offs.  Neurological: She is alert.  Follows commands, Moves all extremities  5/5 strength to BUE and BLE  Sensation intact throughout   Skin: Skin is warm and dry. Capillary refill takes less than 2 seconds.  Psychiatric: She has a normal mood and affect. Her speech is normal and behavior is normal.  Nursing note and vitals reviewed.    ED Treatments / Results  Labs (all labs ordered are listed, but only abnormal results  are displayed) Labs Reviewed  POC URINE PREG, ED    EKG  EKG Interpretation None       Radiology No results found.  Procedures Procedures (including critical care time)  Medications Ordered in ED Medications  ketorolac (TORADOL) injection 60 mg (60 mg Intramuscular Given 02/01/17 1423)     Initial Impression / Assessment and Plan / ED Course  I have reviewed the triage vital signs and the nursing notes.  Pertinent labs & imaging results that were available during my care of the patient were reviewed by me and considered in my medical decision making (see chart for details).     41 year old female who presents with lower back pain that began this morning she was squatting to lift something heavy. No medications prior to arrival. No  red flag symptoms and history. No neuro deficits on exam. Concern for muscular strain given mechanism of injury. History/physical exam are not concerning for spinal abscess or cauda equina. No imaging indicated at this time. Patient also here for intermittent anxiety attacks related to various stressors that has gone on. No SI/HI. No current anxiety attack in the department. We discussed at length regarding her symptoms and whether or not she would need a emergent evaluation in the department. During our discussions, it appears that the anxiety attacks are secondary concern that she is bringing up since she is being seen for primary back pain. Patient appears to be reasonable, does not appear to be intoxicated and is competent to make her own medical decisions. We discussed at length with or not she needed to go through medical clearance to be evaluated by TTS. Expender her that they would decide whether or not she meets criteria for inpatient psychiatric evaluation or outpatient. Given my discussions with her I feel that she would most likely be benefited by outpatient psychiatric resources that she can follow-up with him have an established relationship with.  Explained to the patient and she agrees to plan. We'll plan to obtain urine pregnancy for evaluation. Pending urine pregnancy, we'll give pain medications.  Urine pregnancy negative. Patient given a dose of Toradol in the department.  Re-evaluation. Patient reports improvement in pain after Toradol. She has been able in delay in the department without any difficulty. Patient still with no neuro deficits on exam. We discussed at length regarding the outpatient behavioral resources. She still denying any SI/HI at this time. She states that she feels comfortable going home and following up with the outpatient resources that will provided to her and her discharge papers. At this time I feel that is a reasonable option. Patient is stable for discharge at this time. Patient given medications for symptomatic relief. Patient provided with outpatient psychiatric and behavioral resources that she can follow up with on an outpatient basis. Strict return precautions discussed. Patient expresses understanding and agreement to plan.  Final Clinical Impressions(s) / ED Diagnoses   Final diagnoses:  Muscle strain  Anxiety    New Prescriptions Discharge Medication List as of 02/01/2017  2:44 PM    START taking these medications   Details  cyclobenzaprine (FLEXERIL) 10 MG tablet Take 1 tablet (10 mg total) by mouth 2 (two) times daily as needed for muscle spasms., Starting Tue 02/01/2017, Print    ibuprofen (ADVIL,MOTRIN) 600 MG tablet Take 1 tablet (600 mg total) by mouth every 6 (six) hours as needed., Starting Tue 02/01/2017, Print         Volanda Napoleon, PA-C 02/01/17 1608    Dorie Rank, MD 02/02/17 0900

## 2017-02-01 NOTE — ED Triage Notes (Signed)
Patient c/o bilateral low back pain that started today. Patient states she was squatting to pick up boxes and then began having pain in the lower back.  Patient then mentions that she has been having panic attacks today and for the past week.

## 2017-02-01 NOTE — Discharge Instructions (Signed)
You can take Tylenol or Ibuprofen as directed for pain.  Take Flexeril as prescribed. This medication will make you drowsy so do not drive or drink alcohol when taking it.  Follow-up with one of the primary care doctors listed below for further evaluation.   Follow-up with one of the outpatient psychiatric resources for further evaluation regarding your anxiety.   Return to the Emergency Dept for any worsening pain, fevers, weakness of your arms/legs, urinary or bowel accidents, thoughts of wanting to hurt yourself or others, or any other worsening or concerning symptoms.     If you do not have a primary care doctor you see regularly, please you the list below. Please call them to arrange for follow-up.    No Primary Care Doctor Call [facility]  [phone] Other agencies that provide inexpensive medical care    [place]  [phone]    [facility] Internal Medicine  [place]  [phone]    [facility]  [phone]    Planned Parenthood  [facility]  [phone]   [place] in the [facility]  Intensive Outpatient Programs: [facility]      [place]. [place], [place] Both a day and evening program       [facility]     [address]        [place], [place] [address] [phone]         ADS: Alcohol & Drug Svcs [place]: [phone] or [phone] [address], [place] [address] [email or web address]   Substance Abuse Resources: Alcohol and Drug Services  [place] [phone] [facility] [place]a Pester [phone] Residential & Outpatient Substance Abuse Program  [phone]  Psychological Services: [place]   [phone] [facility]  [phone] [facility], [name] [address] [place], [place]: [phone] or [phone], [email or web address]  Mobile Crisis Teams:                                        Therapeutic Alternatives         Mobile Crisis Care Unit [phone]             [facility] [address] Lady [place] [address] [place] [phone]  Self-Help/Support Groups: [place]. of [employer] of support groups [phone] (call for more info)  Narcotics Anonymous (NA) Caring Services [address] [place] - 2 meetings at this location  Residential Treatment Programs:  [place]       [place] [address], [place], [place]  [address] [place]  [address] [place],  [address] [phone] Admissions: 8am-3pm M-F  [facility] Substance Abuse  Treatment Center     [address], [place] [address]       [phone]         The [place] [address] Jadene Pierini [place], [place]  [facility] [address] [place], [place]  [facility] - Intensive Outpatient      [address] [place] 741     [place], [place]         [facility] ([place]     [place], [place] or [phone]  Residential Treatment Services (RTS), Medicaid [place], [place]  Fellowship [address]                                               [address] [place]  [facility] Resources: [phone]                General Therapy                                                Domenic Schwab, PhD        [address] [place], [place] [address]         [place]   [address] [place], [place] [address] [phone]  [facility] Recovery [address] [place], [place] [address] [phone] Insurance/Medicaid/sponsorship through [facility]                                              [address] [place]                                        [place], [place] [address]    Therapy/tele-psych/case         [place] [address], [place]  [address]  Adolescent/group home/case management [phone]                                           Rosette Reveal PhD       General therapy       Insurance   [phone]         Dr. Adele Schilder, [place], M-F [phone]  [facility] [place] [facility] Dept. [address]  [place] Phone:  [phone]                                  Phone:  [phone]                   Phone:  [place], [place]- [phone]       -     [facility] in [place], [address]             [phone], [place]

## 2017-03-04 ENCOUNTER — Ambulatory Visit (HOSPITAL_COMMUNITY)
Admission: EM | Admit: 2017-03-04 | Discharge: 2017-03-04 | Disposition: A | Discharge status: Home / Self Care | Payer: Medicaid Other | Attending: Internal Medicine | Admitting: Internal Medicine

## 2017-03-04 ENCOUNTER — Encounter (HOSPITAL_COMMUNITY): Payer: Self-pay | Admitting: *Deleted

## 2017-03-04 DIAGNOSIS — R51 Headache: Secondary | ICD-10-CM | POA: Diagnosis not present

## 2017-03-04 DIAGNOSIS — R519 Headache, unspecified: Secondary | ICD-10-CM

## 2017-03-04 NOTE — Discharge Instructions (Signed)
Continue to take ibuprofen as directed for the next few days. This can help with the pain and inflammation. Monitor for worsening headache, nausea, vomiting, weakness, to go to the ED for evaluation.

## 2017-03-04 NOTE — ED Triage Notes (Signed)
Headache   In  Back  Of  Head      Started   Never  Had a  Headache like  This  Before        Symptoms  No     Vomiting     No  injury

## 2017-03-04 NOTE — ED Provider Notes (Signed)
CSN: [id number]     Arrival date & time 03/04/17  1459 History   None    Chief Complaint  Patient presents with  . Headache   (Consider location/radiation/quality/duration/timing/severity/associated sxs/prior Treatment) 41 year old female comes in with 1 day history of headache. Patient stated started this morning, pain in the back of the head, describes the pain as tightness. She took ibuprofen 400 mg with good relief. Headache came back a few hours ago, has not taken anything for the pain since. She denies injury to her head, nausea, vomiting. Denies photophobia, phonophobia. Denies neck pain, neck strain.      Past Medical History:  Diagnosis Date  . BV (bacterial vaginosis)   . GERD (gastroesophageal reflux disease)    Past Surgical History:  Procedure Laterality Date  . DILATION AND CURETTAGE OF UTERUS    . TUBAL LIGATION     Family History  Problem Relation Age of Onset  . Diabetes Mother   . Hypertension Mother   . Hyperlipidemia Mother    Social History  Substance Use Topics  . Smoking status: Current Every Day Smoker    Packs/day: 0.50  . Smokeless tobacco: Never Used  . Alcohol use No     Comment: occassional   OB History    Gravida Para Term Preterm AB Living   3 2 2   1 2    SAB TAB Ectopic Multiple Live Births   1       2     Review of Systems  Constitutional: Negative for chills and diaphoresis.  Eyes: Negative for photophobia.  Musculoskeletal: Negative for neck pain and neck stiffness.  Neurological: Positive for headaches. Negative for dizziness, tremors, syncope, weakness, light-headedness and numbness.  Psychiatric/Behavioral: Negative for confusion.    Allergies  Patient has no known allergies.  Home Medications   Prior to Admission medications   Medication Sig Start Date End Date Taking? Authorizing Provider  cyclobenzaprine (FLEXERIL) 10 MG tablet Take 1 tablet (10 mg total) by mouth 2 (two) times daily as needed for muscle spasms.  02/01/17   Volanda Napoleon, PA-C  dexamethasone (DECADRON) 6 MG tablet Take 1 tablet (6 mg total) by mouth 2 (two) times daily. Patient not taking: Reported on 02/01/2017 11/25/16   Barn[place] Glasgow, NP  diclofenac (VOLTAREN) 75 MG EC tablet Take 1 tablet (75 mg total) by mouth 2 (two) times daily. Patient not taking: Reported on 02/01/2017 11/25/16   Barnet Glasgow, NP  ibuprofen (ADVIL,MOTRIN) 600 MG tablet Take 1 tablet (600 mg total) by mouth every 6 (six) hours as needed. 02/01/17   Volanda Napoleon, PA-C   Meds Ordered and Administered this Visit  Medications - No data to display  BP 132/74 (BP Location: Right Arm)   Pulse 78   Temp 98.6 F (37 C) (Oral)   Resp 18   LMP 03/04/2017   SpO2 100%  No data found.   Physical Exam  Constitutional: She is oriented to person, place, and time. She appears well-developed and well-nourished. No distress.  HENT:  Head: Normocephalic and atraumatic.  Eyes: Conjunctivae are normal. Pupils are equal, round, and reactive to light.  Neck: Normal range of motion. Neck supple.  Cardiovascular: Normal rate, regular rhythm and normal heart sounds.  Exam reveals no gallop and no friction rub.   No murmur heard. Pulmonary/Chest: Effort normal and breath sounds normal. She has no wheezes. She has no rales.  Neurological: She is alert and oriented to person, place, and time. She has  normal strength. No cranial nerve deficit. She displays a negative Romberg sign.  Skin: Skin is warm and dry.  Psychiatric: She has a normal mood and affect. Her behavior is normal. Judgment normal.    Urgent Care Course     Procedures (including critical care time)  Labs Review Labs Reviewed - No data to display  Imaging Review No results found.      MDM   1. Acute intractable headache, unspecified headache type    Discussed with patient headache could be due to many different reasons, such as stress, smoking. Given normal neuro exam, patient to continue  with ibuprofen as directed. Patient to monitor for worsening of symptoms, nausea, vomiting, photophobia, phonophobia to go to the ED for further evaluation.   Ok Edwards, PA-C 03/04/17 1654

## 2017-10-26 ENCOUNTER — Encounter (HOSPITAL_COMMUNITY): Payer: Self-pay | Admitting: Emergency Medicine

## 2017-10-26 ENCOUNTER — Emergency Department (HOSPITAL_COMMUNITY)
Admission: EM | Admit: 2017-10-26 | Discharge: 2017-10-26 | Disposition: A | Discharge status: Home / Self Care | Payer: Self-pay | Attending: Emergency Medicine | Admitting: Emergency Medicine

## 2017-10-26 DIAGNOSIS — F17200 Nicotine dependence, unspecified, uncomplicated: Secondary | ICD-10-CM | POA: Insufficient documentation

## 2017-10-26 DIAGNOSIS — R109 Unspecified abdominal pain: Secondary | ICD-10-CM | POA: Insufficient documentation

## 2017-10-26 DIAGNOSIS — R030 Elevated blood-pressure reading, without diagnosis of hypertension: Secondary | ICD-10-CM | POA: Insufficient documentation

## 2017-10-26 DIAGNOSIS — R197 Diarrhea, unspecified: Secondary | ICD-10-CM | POA: Insufficient documentation

## 2017-10-26 NOTE — Discharge Instructions (Signed)
You were seen in the emergency department today for diarrhea and abdominal cramping.  Your physical exam was reassuring.  Please see the attached handout on foods to eat with a bland diet for diarrhea.  Slowly reintroduce these foods to your diet.  Do not take over-the-counter antidiarrhea medications as these may potentially worsen your symptoms.   Follow with your primary care provider in 3 days if your symptoms have not improved.  If you do not have a primary care provider you may follow-up with our [place] community clinic.  You may additionally call the hotline phone number circled in your discharge instructions in order to set up primary care.  Return to the emergency department for new or worsening symptoms including but not limited to blood in your stool, mucus with your diarrhea, fever, severe abdominal pain, inability to keep down food or fluids, or any other concerns that she may have.

## 2017-10-26 NOTE — ED Provider Notes (Signed)
[facility] DEPT Provider Note   CSN: [id number] Arrival date & time: 10/26/17  1023     History   Chief Complaint Chief Complaint  Patient presents with  . Diarrhea    HPI Terri Duran is a 42 y.o. female with a hx of tobacco use and GERD who presents to the ED with diarrhea that started last night around 1800. Patient states she went to lunch around 12:30 and had chicken at a [employer]. States that she started having watery diarrhea around 18:30- no blood or mucous present. States she has had approximately 4-5 episodes of diarrhea, states most recent was this AM at 9:30. Having some associated abdominal cramping just prior to a bowel movement which is resolved after the bowel movement. She has been hydrating well with PO fluids, has not had eaten today due to trying to avoid the diarrhea. Denies nausea, vomiting, fever, chills, recent long travel or recent abx use.   HPI  Past Medical History:  Diagnosis Date  . BV (bacterial vaginosis)   . GERD (gastroesophageal reflux disease)     Patient Active Problem List   Diagnosis Date Noted  . Morbid obesity ([place]) 03/05/2015  . Family history of diabetes mellitus (DM) 11/21/2013  . High BMI 11/21/2013  . Smoker 11/21/2013    Past Surgical History:  Procedure Laterality Date  . DILATION AND CURETTAGE OF UTERUS    . TUBAL LIGATION      OB History    Gravida Para Term Preterm AB Living   3 2 2   1 2    SAB TAB Ectopic Multiple Live Births   1       2       Home Medications    Prior to Admission medications   Medication Sig Start Date End Date Taking? Authorizing Provider  cyclobenzaprine (FLEXERIL) 10 MG tablet Take 1 tablet (10 mg total) by mouth 2 (two) times daily as needed for muscle spasms. 02/01/17   Volanda Napoleon, PA-C  dexamethasone (DECADRON) 6 MG tablet Take 1 tablet (6 mg total) by mouth 2 (two) times daily. Patient not taking: Reported on 02/01/2017 11/25/16   Barnet Glasgow, NP  diclofenac (VOLTAREN) 75 MG EC tablet Take 1 tablet (75 mg total) by mouth 2 (two) times daily. Patient not taking: Reported on 02/01/2017 11/25/16   Barnet Glasgow, NP  ibuprofen (ADVIL,MOTRIN) 600 MG tablet Take 1 tablet (600 mg total) by mouth every 6 (six) hours as needed. 02/01/17   Volanda Napoleon, PA-C    Family History Family History  Problem Relation Age of Onset  . Diabetes Mother   . Hypertension Mother   . Hyperlipidemia Mother     Social History Social History   Tobacco Use  . Smoking status: Current Every Day Smoker    Packs/day: 0.50  . Smokeless tobacco: Never Used  Substance Use Topics  . Alcohol use: No    Alcohol/week: 0.0 oz    Comment: occassional  . Drug use: No     Allergies   Patient has no known allergies.   Review of Systems Review of Systems  Constitutional: Negative for chills and fever.  Respiratory: Negative for shortness of breath.   Cardiovascular: Negative for chest pain.  Gastrointestinal: Positive for abdominal pain (cramping just prior to BM, resolved quickly after) and diarrhea. Negative for blood in stool, constipation, nausea and vomiting.  Genitourinary: Negative for dysuria.  All other systems reviewed and are negative.  Physical Exam Updated Vital Signs BP 140/85 (BP Location: Right Arm)   Pulse 80   Temp (!) 97.5 F (36.4 C) (Oral)   Resp 12   Ht 5\' 4"  (1.626 m)   Wt 112.5 kg (248 lb)   LMP 09/30/2017   SpO2 100%   BMI 42.57 kg/m   Physical Exam  Constitutional: She appears well-developed and well-nourished. No distress.  HENT:  Head: Normocephalic and atraumatic.  Eyes: Conjunctivae are normal. Right eye exhibits no discharge. Left eye exhibits no discharge.  Cardiovascular: Normal rate and regular rhythm.  No murmur heard. Pulmonary/Chest: Breath sounds normal. No respiratory distress. She has no wheezes. She has no rales.  Abdominal: Soft. She exhibits no distension and no mass. There is no  tenderness. There is no rigidity, no rebound, no guarding, no CVA tenderness and no tenderness at McBurney's point.  Neurological: She is alert.  Clear speech.   Skin: Skin is warm and dry. No rash noted.  Psychiatric: She has a normal mood and affect. Her behavior is normal.  Nursing note and vitals reviewed.   ED Treatments / Results  Labs (all labs ordered are listed, but only abnormal results are displayed) Labs Reviewed - No data to display  EKG  EKG Interpretation None      Radiology No results found.  Procedures Procedures (including critical care time)  Medications Ordered in ED Medications - No data to display   Initial Impression / Assessment and Plan / ED Course  I have reviewed the triage vital signs and the nursing notes.  Pertinent labs & imaging results that were available during my care of the patient were reviewed by me and considered in my medical decision making (see chart for details).   Patient presents with diarrhea. Patient is nontoxic appearing, in no apparent distress, BP somewhat elevated- improved, discussed need for recheck with the patient. Afebrile. Diarrhea is without blood or mucous, patient has not had recent abx or travel- doubt C. Diff or traveler's diarrhea. She is experiencing abdominal cramping only just prior to diarrhea- relieved following- abdomen is non-tender, no peritoneal signs. Short duration of sxs do not think checking for electrolyte abnormalities is indicated at this time. Will DC home with instructions for BRAT diet with PCP follow up and return precautions. I discussed treatment plan, need for PCP follow-up, and return precautions with the patient. Provided opportunity for questions, patient confirmed understanding and is in agreement with plan.   Findings and plan of care discussed with supervising physician Dr.Nanavati who is in agreement.    Final Clinical Impressions(s) / ED Diagnoses   Final diagnoses:  Diarrhea,  unspecified type    ED Discharge Orders    None       Amaryllis Dyke, PA-C 10/26/17 1445    Varney Biles, MD 10/26/17 1635

## 2017-10-26 NOTE — ED Triage Notes (Signed)
Patient presents ambulatory c/o diarrhea and abdominal cramping onset of last night after eating hibachi. No emesis or urinary symptoms. Patient tolerating fluids but state she has not tried to eat today.

## 2017-11-10 ENCOUNTER — Ambulatory Visit (INDEPENDENT_AMBULATORY_CARE_PROVIDER_SITE_OTHER): Payer: Medicaid Other | Admitting: Obstetrics

## 2017-11-10 ENCOUNTER — Other Ambulatory Visit (HOSPITAL_COMMUNITY)
Admission: RE | Admit: 2017-11-10 | Discharge: 2017-11-10 | Disposition: A | Discharge status: Home / Self Care | Payer: Medicaid Other | Source: Ambulatory Visit | Attending: Obstetrics | Admitting: Obstetrics

## 2017-11-10 ENCOUNTER — Encounter: Payer: Self-pay | Admitting: Obstetrics

## 2017-11-10 VITALS — BP 126/77 | HR 94 | Ht 65.0 in | Wt 248.0 lb

## 2017-11-10 DIAGNOSIS — Z1239 Encounter for other screening for malignant neoplasm of breast: Secondary | ICD-10-CM

## 2017-11-10 DIAGNOSIS — Z01419 Encounter for gynecological examination (general) (routine) without abnormal findings: Secondary | ICD-10-CM

## 2017-11-10 DIAGNOSIS — F17210 Nicotine dependence, cigarettes, uncomplicated: Secondary | ICD-10-CM

## 2017-11-10 DIAGNOSIS — Z6841 Body Mass Index (BMI) 40.0 and over, adult: Secondary | ICD-10-CM

## 2017-11-10 DIAGNOSIS — Z309 Encounter for contraceptive management, unspecified: Secondary | ICD-10-CM | POA: Diagnosis not present

## 2017-11-10 NOTE — Progress Notes (Signed)
LMP: 10/24/2017 Mammogram: Need Mammogram scheduled   Last Pap: 09/08/2016 WNL  Colonoscopy: N/A  Contraception: None PCP:  None   STD Screening:  Full Panel  CC: None

## 2017-11-10 NOTE — Progress Notes (Signed)
Subjective:        Terri Duran is a 42 y.o. female here for a routine exam.  Current complaints: None.    Personal health questionnaire:  Is patient [name], have a family history of breast and/or ovarian cancer: no Is there a family history of uterine cancer diagnosed at age < 91, gastrointestinal cancer, urinary tract cancer, family member who is a Field seismologist syndrome-associated carrier: no Is the patient overweight and hypertensive, family history of diabetes, personal history of gestational diabetes, preeclampsia or PCOS: no Is patient over 42, have PCOS,  family history of premature CHD under age 54, diabetes, smoke, have hypertension or peripheral artery disease:  no At any time, has a partner hit, kicked or otherwise hurt or frightened you?: no Over the past 2 weeks, have you felt down, depressed or hopeless?: no Over the past 2 weeks, have you felt little interest or pleasure in doing things?:no   Gynecologic History Patient's last menstrual period was 10/24/2017 (exact date). Contraception: tubal ligation Last Pap: 2018. Results were: normal Last mammogram: none. Results were: none  Obstetric History OB History  Gravida Para Term Preterm AB Living  3 2 2   1 2   SAB TAB Ectopic Multiple Live Births  1       2    # Outcome Date GA Lbr Len/2nd Weight Sex Delivery Anes PTL Lv  3 Term 08/14/02 [redacted]w[redacted]d  6 lb 5 oz (2.863 kg) F CS-LTranv EPI  LIV  2 Term 09/07/00 [redacted]w[redacted]d  6 lb 7 oz (2.92 kg) M CS-LTranv EPI  LIV  1 SAB 1998        DEC      Past Medical History:  Diagnosis Date  . BV (bacterial vaginosis)   . GERD (gastroesophageal reflux disease)     Past Surgical History:  Procedure Laterality Date  . DILATION AND CURETTAGE OF UTERUS    . TUBAL LIGATION      No current outpatient medications on file. No Known Allergies  Social History   Tobacco Use  . Smoking status: Current Every Day Smoker    Packs/day: 0.50  . Smokeless tobacco: Never Used  Substance  Use Topics  . Alcohol use: No    Alcohol/week: 0.0 oz    Comment: occassional    Family History  Problem Relation Age of Onset  . Diabetes Mother   . Hypertension Mother   . Hyperlipidemia Mother       Review of Systems  Constitutional: negative for fatigue and weight loss Respiratory: negative for cough and wheezing Cardiovascular: negative for chest pain, fatigue and palpitations Gastrointestinal: negative for abdominal pain and change in bowel habits Musculoskeletal:negative for myalgias Neurological: negative for gait problems and tremors Behavioral/Psych: negative for abusive relationship, depression Endocrine: negative for temperature intolerance    Genitourinary:negative for abnormal menstrual periods, genital lesions, hot flashes, sexual problems and vaginal discharge Integument/breast: negative for breast lump, breast tenderness, nipple discharge and skin lesion(s)    Objective:       BP 126/77   Pulse 94   Ht 5\' 5"  (1.651 m)   Wt 248 lb (112.5 kg)   LMP 10/24/2017 (Exact Date)   BMI 41.27 kg/m  General:   alert  Skin:   no rash or abnormalities  Lungs:   clear to auscultation bilaterally  Heart:   regular rate and rhythm, S1, S2 normal, no murmur, click, rub or gallop  Breasts:   normal without suspicious masses, skin or nipple changes  or axillary nodes  Abdomen:  normal findings: no organomegaly, soft, non-tender and no hernia  Pelvis:  External genitalia: normal general appearance Urinary system: urethral meatus normal and bladder without fullness, nontender Vaginal: normal without tenderness, induration or masses Cervix: normal appearance Adnexa: normal bimanual exam Uterus: anteverted and non-tender, normal size   Lab Review Urine pregnancy test Labs reviewed yes Radiologic studies reviewed yes  50% of 20 min visit spent on counseling and coordination of care.    Assessment:     1. Encounter for gynecological examination with Papanicolaou  smear of cervix Rx: - Cytology - PAP  2. Screening breast examination Rx: - MM DIGITAL SCREENING BILATERAL; Future - MM DIAG BREAST TOMO BILATERAL; Future  3. Class 3 severe obesity due to excess calories without serious comorbidity with body mass index (BMI) of 40.0 to 44.9 in adult [facility]) - recommended program including caloric reduction. Exercise and behavioral modification  4. Tobacco dependence due to cigarettes - tobacco cessation with medication and behavioral modification recommended    Plan:    Education reviewed: calcium supplements, depression evaluation, low fat, low cholesterol diet, safe sex/STD prevention, self breast exams, smoking cessation and weight bearing exercise. Mammogram ordered. Follow up in: 1 year.   No orders of the defined types were placed in this encounter.  Orders Placed This Encounter  Procedures  . MM DIGITAL SCREENING BILATERAL    Standing Status:   Future    Standing Expiration Date:   01/11/2019    Order Specific Question:   Reason for Exam (SYMPTOM  OR DIAGNOSIS REQUIRED)    Answer:   Screening    Order Specific Question:   Is the patient pregnant?    Answer:   No    Order Specific Question:   Preferred imaging location?    Answer:   [facility]  . MM DIAG BREAST TOMO BILATERAL    Standing Status:   Future    Standing Expiration Date:   01/11/2019    Order Specific Question:   Reason for Exam (SYMPTOM  OR DIAGNOSIS REQUIRED)    Answer:   Screening    Order Specific Question:   Is the patient pregnant?    Answer:   No    Order Specific Question:   Preferred imaging location?    Answer:   [facility]    [name] MD

## 2017-11-11 NOTE — Addendum Note (Signed)
Addended by: Marquette Old on: 11/11/2017 08:30 AM   Modules accepted: Orders

## 2017-11-14 LAB — CERVICOVAGINAL ANCILLARY ONLY
Bacterial vaginitis: NEGATIVE
CHLAMYDIA, DNA PROBE: NEGATIVE
Candida vaginitis: NEGATIVE
Neisseria Gonorrhea: NEGATIVE
Trichomonas: NEGATIVE

## 2017-11-15 LAB — CYTOLOGY - PAP
Diagnosis: NEGATIVE
HPV: NOT DETECTED

## 2018-01-18 DIAGNOSIS — B349 Viral infection, unspecified: Secondary | ICD-10-CM | POA: Insufficient documentation

## 2018-01-18 DIAGNOSIS — F17200 Nicotine dependence, unspecified, uncomplicated: Secondary | ICD-10-CM | POA: Insufficient documentation

## 2018-01-18 NOTE — ED Triage Notes (Signed)
Pt c/o gen body aches and chills since yesterday.  Denies NVD, dysuria, or cough.

## 2018-01-19 ENCOUNTER — Emergency Department (HOSPITAL_COMMUNITY)
Admission: EM | Admit: 2018-01-19 | Discharge: 2018-01-19 | Disposition: A | Discharge status: Home / Self Care | Payer: Medicaid Other | Attending: Emergency Medicine | Admitting: Emergency Medicine

## 2018-01-19 ENCOUNTER — Encounter (HOSPITAL_COMMUNITY): Payer: Self-pay | Admitting: Emergency Medicine

## 2018-01-19 ENCOUNTER — Other Ambulatory Visit: Payer: Self-pay

## 2018-01-19 DIAGNOSIS — B349 Viral infection, unspecified: Secondary | ICD-10-CM

## 2018-01-19 LAB — URINALYSIS, ROUTINE W REFLEX MICROSCOPIC
BILIRUBIN URINE: NEGATIVE
Glucose, UA: NEGATIVE mg/dL
KETONES UR: NEGATIVE mg/dL
LEUKOCYTES UA: NEGATIVE
NITRITE: NEGATIVE
Protein, ur: NEGATIVE mg/dL
SPECIFIC GRAVITY, URINE: 1.013 (ref 1.005–1.030)
pH: 6 (ref 5.0–8.0)

## 2018-01-19 LAB — PREGNANCY, URINE: PREG TEST UR: NEGATIVE

## 2018-01-19 MED ORDER — SODIUM CHLORIDE 0.9 % IV BOLUS: 1000.0000 mL | Freq: Once | INTRAVENOUS | Status: DC

## 2018-01-19 MED ORDER — ONDANSETRON 4 MG PO TBDP
4.0000 mg | ORAL_TABLET | Freq: Once | ORAL | Status: AC
  Administered 2018-01-19: 4 mg via ORAL
  Filled 2018-01-19: qty 1

## 2018-01-19 MED ORDER — IBUPROFEN 600 MG PO TABS: 600.0000 mg | ORAL_TABLET | Freq: Four times a day (QID) | ORAL | 0 refills | Status: DC | PRN

## 2018-01-19 MED ORDER — IBUPROFEN 200 MG PO TABS
600.0000 mg | ORAL_TABLET | Freq: Once | ORAL | Status: AC
  Administered 2018-01-19: 600 mg via ORAL
  Filled 2018-01-19: qty 3

## 2018-01-19 MED ORDER — ONDANSETRON 4 MG PO TBDP: 4.0000 mg | ORAL_TABLET | Freq: Three times a day (TID) | ORAL | 0 refills | Status: DC | PRN

## 2018-01-19 MED ORDER — ONDANSETRON HCL 4 MG/2ML IJ SOLN
4.0000 mg | Freq: Once | INTRAMUSCULAR | Status: DC
  Filled 2018-01-19: qty 2

## 2018-01-19 NOTE — Discharge Instructions (Signed)
Take Zofran for nausea and ibuprofen 600 mg for muscle and body pain. Get plenty of rest and drink enough non-caffeinated fluids to avoid dehydration. Return to the emergency department if symptoms worsen or you develop new symptoms of concern.

## 2018-01-19 NOTE — ED Provider Notes (Signed)
[facility] DEPT Provider Note   CSN: [id number] Arrival date & time: 01/18/18  2353     History   Chief Complaint Chief Complaint  Patient presents with  . Generalized Body Aches  . Chills    HPI Terri Duran is a 42 y.o. female.  Patient presents for evaluation of body and joint aches with nausea since yesterday. No fever, cough, SOB, congestion, vomiting or diarrhea. She denies urinary or vaginal symptoms. She reports more than one co-worker has been ill and she feels she may have been exposed. She endorses eating and drinking less secondary to nausea. She has not taken anything at home for symptoms.   The history is provided by the patient. No language interpreter was used.    Past Medical History:  Diagnosis Date  . BV (bacterial vaginosis)   . GERD (gastroesophageal reflux disease)     Patient Active Problem List   Diagnosis Date Noted  . Morbid obesity ([place]) 03/05/2015  . Family history of diabetes mellitus (DM) 11/21/2013  . High BMI 11/21/2013  . Smoker 11/21/2013    Past Surgical History:  Procedure Laterality Date  . DILATION AND CURETTAGE OF UTERUS    . TUBAL LIGATION       OB History    Gravida  3   Para  2   Term  2   Preterm      AB  1   Living  2     SAB  1   TAB      Ectopic      Multiple      Live Births  2            Home Medications    Prior to Admission medications   Not on File    Family History Family History  Problem Relation Age of Onset  . Diabetes Mother   . Hypertension Mother   . Hyperlipidemia Mother     Social History Social History   Tobacco Use  . Smoking status: Current Every Day Smoker    Packs/day: 0.50  . Smokeless tobacco: Never Used  Substance Use Topics  . Alcohol use: No    Alcohol/week: 0.0 oz    Comment: occassional  . Drug use: No     Allergies   Patient has no known allergies.   Review of Systems Review of Systems  Constitutional:  Negative for chills and fever.  HENT: Negative.   Respiratory: Negative.   Cardiovascular: Negative.   Gastrointestinal: Positive for abdominal pain (epigastric discomfort. ) and nausea. Negative for vomiting.  Musculoskeletal: Positive for myalgias.  Skin: Negative.   Neurological: Negative.      Physical Exam Updated Vital Signs BP (!) 155/93 (BP Location: Right Arm)   Pulse 78   Temp 98.2 F (36.8 C) (Oral)   Resp 18   SpO2 100%   Physical Exam  Constitutional: She appears well-developed and well-nourished.  HENT:  Head: Normocephalic.  Neck: Normal range of motion. Neck supple.  Cardiovascular: Normal rate and regular rhythm.  No murmur heard. Pulmonary/Chest: Effort normal and breath sounds normal. She has no wheezes. She has no rales.  Abdominal: Soft. Bowel sounds are normal. There is no tenderness. There is no rebound and no guarding.  Musculoskeletal: Normal range of motion.  Neurological: She is alert. No cranial nerve deficit.  Skin: Skin is warm and dry. No rash noted.  Psychiatric: She has a normal mood and affect.     ED  Treatments / Results  Labs (all labs ordered are listed, but only abnormal results are displayed) Labs Reviewed  URINALYSIS, ROUTINE W REFLEX MICROSCOPIC - Abnormal; Notable for the following components:      Result Value   Hgb urine dipstick MODERATE (*)    Bacteria, UA RARE (*)    All other components within normal limits  PREGNANCY, URINE    EKG None  Radiology No results found.  Procedures Procedures (including critical care time)  Medications Ordered in ED Medications  sodium chloride 0.9 % bolus 1,000 mL (has no administration in time range)  ondansetron (ZOFRAN) injection 4 mg (has no administration in time range)     Initial Impression / Assessment and Plan / ED Course  I have reviewed the triage vital signs and the nursing notes.  Pertinent labs & imaging results that were available during my care of the  patient were reviewed by me and considered in my medical decision making (see chart for details).     Patient is here for symptoms of chills, nausea w/o vomiting and generalized body aches. No known fever or other symptoms.   IVF's offered however the patient would prefer going home with medications for nausea and drinking plenty of fluids. Ibuprofen for body aches.   No identifiable infection - likely viral illness. UA negative for infection.   She can be discharged home and cautioned to return if symptoms worsen.   Final Clinical Impressions(s) / ED Diagnoses   Final diagnoses:  None   1. Myalgia 2. Nausea  ED Discharge Orders    None       [name], PA-C 01/19/18 0640    Shanon Rosser, MD 01/19/18 [phone]

## 2018-04-12 ENCOUNTER — Other Ambulatory Visit: Payer: Self-pay

## 2018-04-12 ENCOUNTER — Ambulatory Visit (HOSPITAL_COMMUNITY)
Admission: EM | Admit: 2018-04-12 | Discharge: 2018-04-12 | Disposition: A | Discharge status: Home / Self Care | Payer: Medicaid Other | Attending: Family Medicine | Admitting: Family Medicine

## 2018-04-12 ENCOUNTER — Encounter (HOSPITAL_COMMUNITY): Payer: Self-pay | Admitting: Emergency Medicine

## 2018-04-12 DIAGNOSIS — R609 Edema, unspecified: Secondary | ICD-10-CM

## 2018-04-12 DIAGNOSIS — R600 Localized edema: Secondary | ICD-10-CM

## 2018-04-12 DIAGNOSIS — R2243 Localized swelling, mass and lump, lower limb, bilateral: Secondary | ICD-10-CM

## 2018-04-12 MED ORDER — HYDROCHLOROTHIAZIDE 25 MG PO TABS: 25.0000 mg | ORAL_TABLET | Freq: Every day | ORAL | 1 refills | Status: DC

## 2018-04-12 NOTE — ED Provider Notes (Signed)
[facility]    CSN: [id number] Arrival date & time: 04/12/18  0908     History   Chief Complaint Chief Complaint  Patient presents with  . Leg Swelling    bilateral    HPI PATTRICIA WEIHER is a 42 y.o. female.   Patient complains of dependent edema.  She has a job which requires her standing without much walking for 12-hour shifts.  Swelling tends to resolve after she gets off her feet sleeps at night.  She denies any pain.  She denies any history of heart or kidney disease.  There is no definite family history of venous disease.  HPI  Past Medical History:  Diagnosis Date  . BV (bacterial vaginosis)   . GERD (gastroesophageal reflux disease)     Patient Active Problem List   Diagnosis Date Noted  . Morbid obesity ([place]) 03/05/2015  . Family history of diabetes mellitus (DM) 11/21/2013  . High BMI 11/21/2013  . Smoker 11/21/2013    Past Surgical History:  Procedure Laterality Date  . DILATION AND CURETTAGE OF UTERUS    . TUBAL LIGATION      OB History    Gravida  3   Para  2   Term  2   Preterm      AB  1   Living  2     SAB  1   TAB      Ectopic      Multiple      Live Births  2            Home Medications    Prior to Admission medications   Medication Sig Start Date End Date Taking? Authorizing Provider  ibuprofen (ADVIL,MOTRIN) 600 MG tablet Take 1 tablet (600 mg total) by mouth every 6 (six) hours as needed. 01/19/18   [name], PA-C  ondansetron (ZOFRAN ODT) 4 MG disintegrating tablet Take 1 tablet (4 mg total) by mouth every 8 (eight) hours as needed for nausea or vomiting. 01/19/18   [name], PA-C    Family History Family History  Problem Relation Age of Onset  . Diabetes Mother   . Hypertension Mother   . Hyperlipidemia Mother     Social History Social History   Tobacco Use  . Smoking status: Current Every Day Smoker    Packs/day: 0.50  . Smokeless tobacco: Never Used  Substance Use Topics  .  Alcohol use: No    Alcohol/week: 0.0 standard drinks    Comment: occassional  . Drug use: No     Allergies   Patient has no known allergies.   Review of Systems Review of Systems  Constitutional: Negative.   HENT: Negative.   Respiratory: Negative.   Cardiovascular: Positive for leg swelling.  Gastrointestinal: Negative.      Physical Exam Triage Vital Signs ED Triage Vitals  Enc Vitals Group     BP 04/12/18 0923 (!) 143/96     Pulse Rate 04/12/18 0923 77     Resp 04/12/18 0923 16     Temp 04/12/18 0923 97.9 F (36.6 C)     Temp src --      SpO2 04/12/18 0923 100 %     Weight --      Height --      Head Circumference --      Peak Flow --      Pain Score 04/12/18 0933 3     Pain Loc --      Pain  Edu? --      Excl. in [place]? --    No data found.  Updated Vital Signs BP (!) 143/96 (BP Location: Left Arm)   Pulse 77   Temp 97.9 F (36.6 C)   Resp 16   LMP 04/06/2018 (Exact Date)   SpO2 100%   Visual Acuity Right Eye Distance:   Left Eye Distance:   Bilateral Distance:    Right Eye Near:   Left Eye Near:    Bilateral Near:     Physical Exam  Constitutional: She appears well-developed and well-nourished.  Cardiovascular: Normal rate and regular rhythm.  There is 1+ edema at ankles and feet  Pulmonary/Chest: Effort normal and breath sounds normal.     UC Treatments / Results  Labs (all labs ordered are listed, but only abnormal results are displayed) Labs Reviewed - No data to display  EKG None  Radiology No results found.  Procedures Procedures (including critical care time)  Medications Ordered in UC Medications - No data to display  Initial Impression / Assessment and Plan / UC Course  I have reviewed the triage vital signs and the nursing notes.  Pertinent labs & imaging results that were available during my care of the patient were reviewed by me and considered in my medical decision making (see chart for details).     Dependent  edema probably secondary to venous disease. Final Clinical Impressions(s) / UC Diagnoses   Final diagnoses:  None   Discharge Instructions   None    ED Prescriptions    None     Controlled Substance Prescriptions  Controlled Substance Registry consulted? No   Wardell Honour, MD 04/12/18 [phone]

## 2018-04-12 NOTE — ED Triage Notes (Signed)
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  For the last three days she has been suffering from bilateral foot swelling while on her feet.  The swelling will go down some when she props them up.  She states her feet and lower legs are very tight.

## 2018-04-26 ENCOUNTER — Encounter (HOSPITAL_COMMUNITY): Payer: Self-pay

## 2018-04-26 ENCOUNTER — Ambulatory Visit (HOSPITAL_COMMUNITY)
Admission: EM | Admit: 2018-04-26 | Discharge: 2018-04-26 | Disposition: A | Discharge status: Home / Self Care | Payer: Medicaid Other | Attending: Family Medicine | Admitting: Family Medicine

## 2018-04-26 ENCOUNTER — Other Ambulatory Visit: Payer: Self-pay

## 2018-04-26 DIAGNOSIS — M79605 Pain in left leg: Secondary | ICD-10-CM

## 2018-04-26 MED ORDER — PREDNISONE 50 MG PO TABS: 50.0000 mg | ORAL_TABLET | Freq: Every day | ORAL | 0 refills | Status: AC

## 2018-04-26 NOTE — Discharge Instructions (Signed)
Please begin prednisone daily with food on your stomach for the next 5 days  May add in ibuprofen or Tylenol for additional pain relief, but do not take at the same time as prednisone and spread this out as this may increase your risk of developing an ulcer or GI bleed.  Please use an Ace wrap to see if this helps with swelling and to help with pain while on your feet.  Please follow-up if symptoms not improving or symptoms worsening, developing increased swelling, pain or redness to your shin.

## 2018-04-26 NOTE — ED Provider Notes (Addendum)
[place]    CSN: [id number] Arrival date & time: 04/26/18  1818     History   Chief Complaint Chief Complaint  Patient presents with  . Leg Pain    left    HPI Terri Duran is a 42 y.o. female history of tobacco use presenting today for evaluation of left lower leg pain.  Patient states that for the past month she has had pain to the front part of her shin.  She notes a tingling and burning sensation that worsens especially when she is on her feet or walking for most of the day.  She states that her symptoms began after she accidentally hit her leg on a machine.  She also notes that she has had issues with swelling in her legs.  Denies any calf pain.  Denies any previous DVT/PE.  Has been using ibuprofen with minimal relief.  HPI  Past Medical History:  Diagnosis Date  . BV (bacterial vaginosis)   . GERD (gastroesophageal reflux disease)     Patient Active Problem List   Diagnosis Date Noted  . Morbid obesity ([place]) 03/05/2015  . Family history of diabetes mellitus (DM) 11/21/2013  . High BMI 11/21/2013  . Smoker 11/21/2013    Past Surgical History:  Procedure Laterality Date  . DILATION AND CURETTAGE OF UTERUS    . TUBAL LIGATION      OB History    Gravida  3   Para  2   Term  2   Preterm      AB  1   Living  2     SAB  1   TAB      Ectopic      Multiple      Live Births  2            Home Medications    Prior to Admission medications   Medication Sig Start Date End Date Taking? Authorizing Provider  ibuprofen (ADVIL,MOTRIN) 600 MG tablet Take 1 tablet (600 mg total) by mouth every 6 (six) hours as needed. 01/19/18  Yes Upstill, Nehemiah Settle, PA-C  hydrochlorothiazide (HYDRODIURIL) 25 MG tablet Take 1 tablet (25 mg total) by mouth daily. 04/12/18   Wardell Honour, MD  ondansetron (ZOFRAN ODT) 4 MG disintegrating tablet Take 1 tablet (4 mg total) by mouth every 8 (eight) hours as needed for nausea or vomiting. 01/19/18   [name], PA-C  predniSONE (DELTASONE) 50 MG tablet Take 1 tablet (50 mg total) by mouth daily for 5 days. 04/26/18 05/01/18  Dovie Kapusta, Elesa Hacker, PA-C    Family History Family History  Problem Relation Age of Onset  . Diabetes Mother   . Hypertension Mother   . Hyperlipidemia Mother     Social History Social History   Tobacco Use  . Smoking status: Current Every Day Smoker    Packs/day: 0.50  . Smokeless tobacco: Never Used  Substance Use Topics  . Alcohol use: No    Alcohol/week: 0.0 standard drinks    Comment: occassional  . Drug use: No     Allergies   Patient has no known allergies.   Review of Systems Review of Systems  Constitutional: Negative for fatigue and fever.  Eyes: Negative for visual disturbance.  Respiratory: Negative for shortness of breath.   Cardiovascular: Negative for chest pain.  Gastrointestinal: Negative for abdominal pain, nausea and vomiting.  Musculoskeletal: Positive for myalgias. Negative for arthralgias and joint swelling.  Skin: Negative for color change, rash  and wound.  Neurological: Positive for numbness. Negative for dizziness, weakness, light-headedness and headaches.     Physical Exam Triage Vital Signs ED Triage Vitals  Enc Vitals Group     BP 04/26/18 1832 (!) 138/92     Pulse Rate 04/26/18 1832 78     Resp 04/26/18 1832 17     Temp 04/26/18 1832 97.9 F (36.6 C)     Temp Source 04/26/18 1832 Oral     SpO2 04/26/18 1832 97 %     Weight --      Height --      Head Circumference --      Peak Flow --      Pain Score 04/26/18 1833 6     Pain Loc --      Pain Edu? --      Excl. in [place]? --    No data found.  Updated Vital Signs BP (!) 138/92 (BP Location: Left Arm)   Pulse 78   Temp 97.9 F (36.6 C) (Oral)   Resp 17   LMP 04/06/2018 (Exact Date)   SpO2 97%   Visual Acuity Right Eye Distance:   Left Eye Distance:   Bilateral Distance:    Right Eye Near:   Left Eye Near:    Bilateral Near:     Physical Exam    Constitutional: She is oriented to person, place, and time. She appears well-developed and well-nourished.  No acute distress  HENT:  Head: Normocephalic and atraumatic.  Nose: Nose normal.  Eyes: Conjunctivae are normal.  Neck: Neck supple.  Cardiovascular: Normal rate.  Pulmonary/Chest: Effort normal. No respiratory distress.  Breathing comfortably at rest, CTABL, no wheezing, rales or other adventitious sounds auscultated  Abdominal: She exhibits no distension.  Musculoskeletal: Normal range of motion.  No obvious swelling, deformity or erythema to anterior aspect of left shin, no calf tenderness or erythema, no cord palpated, negative Homans.  Mild tenderness to palpation overlying anterior aspect of shin.  Full active range of motion at knee and ankle.  Dorsalis pedis 2+  Neurological: She is alert and oriented to person, place, and time.  Skin: Skin is warm and dry.  Psychiatric: She has a normal mood and affect.  Nursing note and vitals reviewed.    UC Treatments / Results  Labs (all labs ordered are listed, but only abnormal results are displayed) Labs Reviewed - No data to display  EKG None  Radiology No results found.  Procedures Procedures (including critical care time)  Medications Ordered in UC Medications - No data to display  Initial Impression / Assessment and Plan / UC Course  I have reviewed the triage vital signs and the nursing notes.  Pertinent labs & imaging results that were available during my care of the patient were reviewed by me and considered in my medical decision making (see chart for details).     Patient with possible nerve injury versus overuse injury versus related to dependent edema.  Will provide prednisone to treat for possible nerve pain for 5 days, Ace wrap to help with compression and swelling as well as elevation.  Discussed with patient that this may become a chronic issue, and prednisone would not be a chronic medicine, but may  consider trial of gabapentin or Cymbalta in the future for further nerve pain.  Pain may also be muscular from overuse.  No signs of DVT, neurovascularly intact..Discussed strict return precautions. Patient verbalized understanding and is agreeable with plan.  Final Clinical Impressions(s) /  UC Diagnoses   Final diagnoses:  Left leg pain     Discharge Instructions     Please begin prednisone daily with food on your stomach for the next 5 days  May add in ibuprofen or Tylenol for additional pain relief, but do not take at the same time as prednisone and spread this out as this may increase your risk of developing an ulcer or GI bleed.  Please use an Ace wrap to see if this helps with swelling and to help with pain while on your feet.  Please follow-up if symptoms not improving or symptoms worsening, developing increased swelling, pain or redness to your shin.   ED Prescriptions    Medication Sig Dispense Auth. Provider   predniSONE (DELTASONE) 50 MG tablet Take 1 tablet (50 mg total) by mouth daily for 5 days. 5 tablet Weldon Nouri C, PA-C     Controlled Substance Prescriptions [place] Controlled Substance Registry consulted? Not Applicable   Janith Lima, PA-C 04/26/18 1933    Janith Lima, [place] 04/26/18 1934

## 2018-04-26 NOTE — ED Triage Notes (Addendum)
Pt presents to [facility] for lower left leg pain and numbness x1 week. Pt has taken Ibuprofen to treat pain but has no relief.

## 2018-07-03 ENCOUNTER — Other Ambulatory Visit: Payer: Self-pay

## 2018-07-03 ENCOUNTER — Ambulatory Visit (HOSPITAL_COMMUNITY)
Admission: EM | Admit: 2018-07-03 | Discharge: 2018-07-03 | Disposition: A | Discharge status: Home / Self Care | Payer: Medicaid Other | Attending: Family Medicine | Admitting: Family Medicine

## 2018-07-03 ENCOUNTER — Encounter (HOSPITAL_COMMUNITY): Payer: Self-pay | Admitting: Emergency Medicine

## 2018-07-03 DIAGNOSIS — M25551 Pain in right hip: Secondary | ICD-10-CM

## 2018-07-03 MED ORDER — MELOXICAM 7.5 MG PO TABS: 7.5000 mg | ORAL_TABLET | Freq: Every day | ORAL | 0 refills | Status: DC

## 2018-07-03 MED ORDER — METHOCARBAMOL 500 MG PO TABS: 500.0000 mg | ORAL_TABLET | Freq: Two times a day (BID) | ORAL | 0 refills | Status: DC
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
## 2019-03-09 ENCOUNTER — Ambulatory Visit: Payer: Medicaid Other | Admitting: Obstetrics

## 2019-03-19 ENCOUNTER — Encounter: Payer: Self-pay | Admitting: Obstetrics

## 2019-03-19 ENCOUNTER — Ambulatory Visit (INDEPENDENT_AMBULATORY_CARE_PROVIDER_SITE_OTHER): Payer: Self-pay | Admitting: Obstetrics

## 2019-03-19 ENCOUNTER — Other Ambulatory Visit (HOSPITAL_COMMUNITY)
Admission: RE | Admit: 2019-03-19 | Discharge: 2019-03-19 | Disposition: A | Discharge status: Home / Self Care | Payer: Medicaid Other | Source: Ambulatory Visit | Attending: Obstetrics | Admitting: Obstetrics

## 2019-03-19 ENCOUNTER — Other Ambulatory Visit: Payer: Self-pay

## 2019-03-19 VITALS — BP 123/70 | HR 79 | Ht 65.0 in | Wt 278.3 lb

## 2019-03-19 DIAGNOSIS — Z1239 Encounter for other screening for malignant neoplasm of breast: Secondary | ICD-10-CM | POA: Insufficient documentation

## 2019-03-19 DIAGNOSIS — Z0000 Encounter for general adult medical examination without abnormal findings: Secondary | ICD-10-CM

## 2019-03-19 DIAGNOSIS — N898 Other specified noninflammatory disorders of vagina: Secondary | ICD-10-CM

## 2019-03-19 DIAGNOSIS — Z01419 Encounter for gynecological examination (general) (routine) without abnormal findings: Secondary | ICD-10-CM

## 2019-03-19 DIAGNOSIS — Z113 Encounter for screening for infections with a predominantly sexual mode of transmission: Secondary | ICD-10-CM

## 2019-03-19 NOTE — Progress Notes (Signed)
Presented for AEX/PAP.  She stated that she the HIV test done this year,  GAD-7=0  Reports no problems today.

## 2019-03-19 NOTE — Progress Notes (Signed)
Subjective:        Terri Duran is a 43 y.o. female here for a routine exam.  Current complaints: None.    Personal health questionnaire:  Is patient [name], have a family history of breast and/or ovarian cancer: no Is there a family history of uterine cancer diagnosed at age < 100, gastrointestinal cancer, urinary tract cancer, family member who is a Field seismologist syndrome-associated carrier: no Is the patient overweight and hypertensive, family history of diabetes, personal history of gestational diabetes, preeclampsia or PCOS: yes Is patient over 51, have PCOS,  family history of premature CHD under age 88, diabetes, smoke, have hypertension or peripheral artery disease:  no At any time, has a partner hit, kicked or otherwise hurt or frightened you?: no Over the past 2 weeks, have you felt down, depressed or hopeless?: no Over the past 2 weeks, have you felt little interest or pleasure in doing things?:no   Gynecologic History Patient's last menstrual period was 03/06/2019 (exact date). Contraception: post menopausal status and tubal ligation Last Pap: 11-10-2017. Results were: normal Last mammogram: n/a. Results were: n/a  Obstetric History OB History  Gravida Para Term Preterm AB Living  3 2 2   1 2   SAB TAB Ectopic Multiple Live Births  1       2    # Outcome Date GA Lbr Len/2nd Weight Sex Delivery Anes PTL Lv  3 Term 08/14/02 [redacted]w[redacted]d  6 lb 5 oz (2.863 kg) F CS-LTranv EPI  LIV  2 Term 09/07/00 [redacted]w[redacted]d  6 lb 7 oz (2.92 kg) M CS-LTranv EPI  LIV  1 SAB 1998        DEC    Past Medical History:  Diagnosis Date  . BV (bacterial vaginosis)   . GERD (gastroesophageal reflux disease)     Past Surgical History:  Procedure Laterality Date  . DILATION AND CURETTAGE OF UTERUS    . TUBAL LIGATION       Current Outpatient Medications:  .  hydrochlorothiazide (HYDRODIURIL) 25 MG tablet, Take 1 tablet (25 mg total) by mouth daily., Disp: 30 tablet, Rfl: 1 .  meloxicam (MOBIC)  7.5 MG tablet, Take 1 tablet (7.5 mg total) by mouth daily., Disp: 15 tablet, Rfl: 0 .  methocarbamol (ROBAXIN) 500 MG tablet, Take 1 tablet (500 mg total) by mouth 2 (two) times daily., Disp: 20 tablet, Rfl: 0 .  ondansetron (ZOFRAN ODT) 4 MG disintegrating tablet, Take 1 tablet (4 mg total) by mouth every 8 (eight) hours as needed for nausea or vomiting., Disp: 20 tablet, Rfl: 0 No Known Allergies  Social History   Tobacco Use  . Smoking status: Current Every Day Smoker    Packs/day: 0.50  . Smokeless tobacco: Never Used  Substance Use Topics  . Alcohol use: No    Alcohol/week: 0.0 standard drinks    Comment: occassional    Family History  Problem Relation Age of Onset  . Diabetes Mother   . Hypertension Mother   . Hyperlipidemia Mother       Review of Systems  Constitutional: negative for fatigue and weight loss Respiratory: negative for cough and wheezing Cardiovascular: negative for chest pain, fatigue and palpitations Gastrointestinal: negative for abdominal pain and change in bowel habits Musculoskeletal:negative for myalgias Neurological: negative for gait problems and tremors Behavioral/Psych: negative for abusive relationship, depression Endocrine: negative for temperature intolerance    Genitourinary:negative for abnormal menstrual periods, genital lesions, hot flashes, sexual problems and vaginal discharge Integument/breast: negative for  breast lump, breast tenderness, nipple discharge and skin lesion(s)    Objective:       BP 123/70   Pulse 79   Ht 5\' 5"  (1.651 m)   Wt 278 lb 4.8 oz (126.2 kg)   LMP 03/06/2019 (Exact Date)   BMI 46.31 kg/m  General:   alert  Skin:   no rash or abnormalities  Lungs:   clear to auscultation bilaterally  Heart:   regular rate and rhythm, S1, S2 normal, no murmur, click, rub or gallop  Breasts:   normal without suspicious masses, skin or nipple changes or axillary nodes  Abdomen:  normal findings: no organomegaly, soft,  non-tender and no hernia  Pelvis:  External genitalia: normal general appearance Urinary system: urethral meatus normal and bladder without fullness, nontender Vaginal: normal without tenderness, induration or masses Cervix: normal appearance Adnexa: normal bimanual exam Uterus: anteverted and non-tender, normal size   Lab Review Urine pregnancy test Labs reviewed yes Radiologic studies reviewed no  50% of 25 min visit spent on counseling and coordination of care.   Assessment:     1. Encounter for routine gynecological examination with Papanicolaou smear of cervix  2. Vaginal discharge Rx: - Cervicovaginal ancillary only( [place])  3. Screen for STD (sexually transmitted disease) Rx: - RPR  4. Screening breast examination Rx: - MM Digital Screening; Future - Cytology - PAP( [place])    Plan:    Education reviewed: calcium supplements, depression evaluation, low fat, low cholesterol diet, safe sex/STD prevention, self breast exams and weight bearing exercise. Mammogram ordered. Follow up in: 1 year.   No orders of the defined types were placed in this encounter.  Orders Placed This Encounter  Procedures  . MM Digital Screening    Standing Status:   Future    Standing Expiration Date:   05/19/2020    Order Specific Question:   Reason for Exam (SYMPTOM  OR DIAGNOSIS REQUIRED)    Answer:   Screening    Order Specific Question:   Is the patient pregnant?    Answer:   No    Order Specific Question:   Preferred imaging location?    Answer:   [facility]  . RPR    [name] MD 03-19-2019

## 2019-03-20 LAB — CYTOLOGY - PAP
Diagnosis: NEGATIVE
HPV: NOT DETECTED

## 2019-03-20 LAB — CERVICOVAGINAL ANCILLARY ONLY
Chlamydia: NEGATIVE
Neisseria Gonorrhea: NEGATIVE

## 2019-04-17 ENCOUNTER — Other Ambulatory Visit: Payer: Self-pay

## 2019-04-17 DIAGNOSIS — Z20822 Contact with and (suspected) exposure to covid-19: Secondary | ICD-10-CM

## 2019-04-18 LAB — NOVEL CORONAVIRUS, NAA: SARS-CoV-2, NAA: NOT DETECTED

## 2019-04-20 ENCOUNTER — Encounter

## 2019-07-24 ENCOUNTER — Ambulatory Visit (HOSPITAL_COMMUNITY)
Admission: EM | Admit: 2019-07-24 | Discharge: 2019-07-24 | Disposition: A | Discharge status: Home / Self Care | Payer: Medicaid Other | Attending: Family Medicine | Admitting: Family Medicine

## 2019-07-24 ENCOUNTER — Encounter (HOSPITAL_COMMUNITY): Payer: Self-pay

## 2019-07-24 ENCOUNTER — Other Ambulatory Visit: Payer: Self-pay

## 2019-07-24 DIAGNOSIS — M5431 Sciatica, right side: Secondary | ICD-10-CM

## 2019-07-24 MED ORDER — PREDNISONE 50 MG PO TABS: ORAL_TABLET | ORAL | 1 refills | Status: DC

## 2019-07-24 MED FILL — predniSONE 50 MG TABS: 50 | 5 days supply | Qty: 5 | Fill #0
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
## 2019-07-24 NOTE — ED Triage Notes (Signed)
Pt presents with right hip pain that radiates down right leg X 2 days.

## 2019-09-10 ENCOUNTER — Other Ambulatory Visit: Payer: Self-pay

## 2019-09-10 DIAGNOSIS — Z20822 Contact with and (suspected) exposure to covid-19: Secondary | ICD-10-CM

## 2019-09-12 LAB — NOVEL CORONAVIRUS, NAA: SARS-CoV-2, NAA: NOT DETECTED

## 2020-02-28 DIAGNOSIS — Z419 Encounter for procedure for purposes other than remedying health state, unspecified: Secondary | ICD-10-CM | POA: Diagnosis not present

## 2020-03-04 DIAGNOSIS — Z5181 Encounter for therapeutic drug level monitoring: Secondary | ICD-10-CM | POA: Diagnosis not present

## 2020-03-05 DIAGNOSIS — Z5181 Encounter for therapeutic drug level monitoring: Secondary | ICD-10-CM | POA: Diagnosis not present

## 2020-03-06 DIAGNOSIS — Z5181 Encounter for therapeutic drug level monitoring: Secondary | ICD-10-CM | POA: Diagnosis not present

## 2020-03-08 ENCOUNTER — Ambulatory Visit (HOSPITAL_COMMUNITY)
Admission: EM | Admit: 2020-03-08 | Discharge: 2020-03-08 | Disposition: A | Discharge status: Home / Self Care | Payer: Medicaid Other | Attending: Emergency Medicine | Admitting: Emergency Medicine

## 2020-03-08 ENCOUNTER — Other Ambulatory Visit: Payer: Self-pay

## 2020-03-08 ENCOUNTER — Ambulatory Visit (INDEPENDENT_AMBULATORY_CARE_PROVIDER_SITE_OTHER): Payer: Medicaid Other

## 2020-03-08 ENCOUNTER — Encounter (HOSPITAL_COMMUNITY): Payer: Self-pay

## 2020-03-08 DIAGNOSIS — S61412A Laceration without foreign body of left hand, initial encounter: Secondary | ICD-10-CM

## 2020-03-08 MED ORDER — TETANUS-DIPHTH-ACELL PERTUSSIS 5-2.5-18.5 LF-MCG/0.5 IM SUSP
INTRAMUSCULAR | Status: AC
  Filled 2020-03-08: qty 0.5

## 2020-03-08 MED ORDER — TETANUS-DIPHTH-ACELL PERTUSSIS 5-2.5-18.5 LF-MCG/0.5 IM SUSP
0.5000 mL | Freq: Once | INTRAMUSCULAR | Status: AC
  Administered 2020-03-08: 0.5 mL via INTRAMUSCULAR

## 2020-03-08 NOTE — ED Triage Notes (Signed)
Pt presents to UC with laceration to top of left hand. Bleeding controlled with gauze and coban at this time. Pt denies pain at site. Pt states laceration was sustained from piece of glass. Pt denies OTC treatments.

## 2020-03-08 NOTE — Discharge Instructions (Signed)
Keep the wound clean and dry as able.  The adherent strips will eventually peel off- goal of keeping them in place for approximately 5 days.  If they fall off prematurely use of a butterfly bandage from the pharmacy will work just fine.  Once bandage off then cleanse area with soap and water daily until healed completely.  Return for any concerns of infection- redness, warmth, swelling, pus drainage

## 2020-03-09 DIAGNOSIS — S61412A Laceration without foreign body of left hand, initial encounter: Secondary | ICD-10-CM | POA: Diagnosis not present

## 2020-03-09 NOTE — ED Provider Notes (Signed)
[place]    CSN: [id number] Arrival date & time: 03/08/20  1017      History   Chief Complaint Chief Complaint  Patient presents with  . Extremity Laceration    HPI Terri Duran is a 44 y.o. female.   Terri Duran presents with complaints of laceration to dorsal aspect of left hand. She was in her shed cleaning, reached down, accidentally cutting her hand on glass which she didn't realize was broken. Hasn't cleansed the wound. Unknown last tdap. No numbness tingling or weakness to the fingers. ROM and sensation intact. Bleeding controlled at this time.    ROS per HPI, negative if not otherwise mentioned.      Past Medical History:  Diagnosis Date  . BV (bacterial vaginosis)   . GERD (gastroesophageal reflux disease)     Patient Active Problem List   Diagnosis Date Noted  . Morbid obesity ([place]) 03/05/2015  . Family history of diabetes mellitus (DM) 11/21/2013  . High BMI 11/21/2013  . Smoker 11/21/2013    Past Surgical History:  Procedure Laterality Date  . DILATION AND CURETTAGE OF UTERUS    . TUBAL LIGATION      OB History    Gravida  3   Para  2   Term  2   Preterm      AB  1   Living  2     SAB  1   TAB      Ectopic      Multiple      Live Births  2            Home Medications    Prior to Admission medications   Medication Sig Start Date End Date Taking? Authorizing Provider  predniSONE (DELTASONE) 50 MG tablet One daily with food 07/24/19   Robyn Haber, MD  hydrochlorothiazide (HYDRODIURIL) 25 MG tablet Take 1 tablet (25 mg total) by mouth daily. 04/12/18 07/24/19  Wardell Honour, MD    Family History Family History  Problem Relation Age of Onset  . Diabetes Mother   . Hypertension Mother   . Hyperlipidemia Mother     Social History Social History   Tobacco Use  . Smoking status: Current Every Day Smoker    Packs/day: 0.50  . Smokeless tobacco: Never Used  Vaping Use  . Vaping Use: Never  used  Substance Use Topics  . Alcohol use: No    Alcohol/week: 0.0 standard drinks    Comment: occassional  . Drug use: No     Allergies   Patient has no known allergies.   Review of Systems Review of Systems   Physical Exam Triage Vital Signs ED Triage Vitals  Enc Vitals Group     BP 03/08/20 1108 135/78     Pulse Rate 03/08/20 1108 88     Resp 03/08/20 1108 16     Temp 03/08/20 1108 98.2 F (36.8 C)     Temp Source 03/08/20 1108 Oral     SpO2 03/08/20 1108 100 %     Weight --      Height --      Head Circumference --      Peak Flow --      Pain Score 03/08/20 1109 0     Pain Loc --      Pain Edu? --      Excl. in [place]? --    No data found.  Updated Vital Signs BP 135/78 (BP Location: Right  Arm)   Pulse 88   Temp 98.2 F (36.8 C) (Oral)   Resp 16   LMP 03/08/2020 (Exact Date)   SpO2 100%   Visual Acuity Right Eye Distance:   Left Eye Distance:   Bilateral Distance:    Right Eye Near:   Left Eye Near:    Bilateral Near:     Physical Exam Constitutional:      General: She is not in acute distress.    Appearance: She is well-developed.  Cardiovascular:     Rate and Rhythm: Normal rate.  Pulmonary:     Effort: Pulmonary effort is normal.  Musculoskeletal:     Left hand: Laceration present. No tenderness or bony tenderness. Normal range of motion. Normal strength. Normal sensation. There is no disruption of two-point discrimination. Normal capillary refill. Normal pulse.     Comments: Approximately 0.5cm superficial laceration to soft tissue of dorsum of left hand between 4th and 5th metacarpal; <32mm in depth; parallel to this is a 0.25 cm superficial laceration; both are linear, well approximated; no underlying structures visible or damaged, no active drainage; full ROM of all fingers with equal strength   Skin:    General: Skin is warm and dry.  Neurological:     Mental Status: She is alert and oriented to person, place, and time.      UC  Treatments / Results  Labs (all labs ordered are listed, but only abnormal results are displayed) Labs Reviewed - No data to display  EKG   Radiology DG Hand Complete Left  Result Date: 03/08/2020 CLINICAL DATA:  Laceration from glass. Rule out foreign body presence. EXAM: LEFT HAND - COMPLETE 3+ VIEW COMPARISON:  None. FINDINGS: Osseous alignment is normal. No fracture line or displaced fracture fragment seen. No radiodense foreign body appreciated within the soft tissues. IMPRESSION: Negative. No radiodense foreign body appreciated. Electronically Signed   By: Franki Cabot M.D.   On: 03/08/2020 11:55    Procedures Laceration Repair  Date/Time: 03/09/2020 9:16 PM Performed by: Zigmund Gottron, NP Authorized by: Zigmund Gottron, NP   Consent:    Consent obtained:  Verbal   Consent given by:  Patient   Risks discussed:  Infection, pain, poor cosmetic result, vascular damage and poor wound healing   Alternatives discussed:  Observation and no treatment Anesthesia (see MAR for exact dosages):    Anesthesia method:  None Laceration details:    Location:  Hand   Hand location:  L hand, dorsum   Length (cm):  0.5   Depth (mm):  1 Repair type:    Repair type:  Simple Exploration:    Wound exploration: wound explored through full range of motion and entire depth of wound probed and visualized     Wound extent: no fascia violation noted, no foreign bodies/material noted, no tendon damage noted, no underlying fracture noted and no vascular damage noted   Treatment:    Area cleansed with:  Betadine, soap and water and Shur-Clens   Foreign body removal: non visisble on exam or with xray    Skin repair:    Repair method:  Tissue adhesive (Derma-strip) Approximation:    Approximation:  Close Post-procedure details:    Dressing:  Non-adherent dressing   Patient tolerance of procedure:  Tolerated well, no immediate complications   (including critical care time)  Medications  Ordered in UC Medications  Tdap (BOOSTRIX) injection 0.5 mL (0.5 mLs Intramuscular Given 03/08/20 1147)    Initial Impression / Assessment  and Plan / UC Course  I have reviewed the triage vital signs and the nursing notes.  Pertinent labs & imaging results that were available during my care of the patient were reviewed by me and considered in my medical decision making (see chart for details).     Derma clipx 1 to superficial laceration to left hand from glass cut. Xray normal and without visualization of any FB. No underlying structure injury. Bleeding well controlled. Cleansed extensively prior to closure. Wound care discussed. Return precautions provided.  Patient verbalized understanding and agreeable to plan.   Final Clinical Impressions(s) / UC Diagnoses   Final diagnoses:  Laceration of left hand without foreign body, initial encounter     Discharge Instructions     Keep the wound clean and dry as able.  The adherent strips will eventually peel off- goal of keeping them in place for approximately 5 days.  If they fall off prematurely use of a butterfly bandage from the pharmacy will work just fine.  Once bandage off then cleanse area with soap and water daily until healed completely.  Return for any concerns of infection- redness, warmth, swelling, pus drainage   ED Prescriptions    None     PDMP not reviewed this encounter.   Zigmund Gottron, NP 03/09/20 2120

## 2020-03-10 DIAGNOSIS — Z5181 Encounter for therapeutic drug level monitoring: Secondary | ICD-10-CM | POA: Diagnosis not present

## 2020-03-11 DIAGNOSIS — Z5181 Encounter for therapeutic drug level monitoring: Secondary | ICD-10-CM | POA: Diagnosis not present

## 2020-03-16 DIAGNOSIS — Z5181 Encounter for therapeutic drug level monitoring: Secondary | ICD-10-CM | POA: Diagnosis not present

## 2020-03-18 DIAGNOSIS — Z5181 Encounter for therapeutic drug level monitoring: Secondary | ICD-10-CM | POA: Diagnosis not present

## 2020-03-23 DIAGNOSIS — Z5181 Encounter for therapeutic drug level monitoring: Secondary | ICD-10-CM | POA: Diagnosis not present

## 2020-03-24 ENCOUNTER — Encounter: Payer: Self-pay | Admitting: Internal Medicine

## 2020-03-24 ENCOUNTER — Ambulatory Visit: Payer: Medicaid Other | Attending: Internal Medicine | Admitting: Internal Medicine

## 2020-03-24 DIAGNOSIS — G5792 Unspecified mononeuropathy of left lower limb: Secondary | ICD-10-CM

## 2020-03-24 DIAGNOSIS — M25561 Pain in right knee: Secondary | ICD-10-CM | POA: Insufficient documentation

## 2020-03-24 DIAGNOSIS — M79604 Pain in right leg: Secondary | ICD-10-CM | POA: Diagnosis not present

## 2020-03-24 DIAGNOSIS — G8929 Other chronic pain: Secondary | ICD-10-CM | POA: Diagnosis not present

## 2020-03-24 DIAGNOSIS — M545 Low back pain: Secondary | ICD-10-CM

## 2020-03-24 DIAGNOSIS — D229 Melanocytic nevi, unspecified: Secondary | ICD-10-CM

## 2020-03-24 MED ORDER — METHOCARBAMOL 500 MG PO TABS: 500.0000 mg | ORAL_TABLET | Freq: Every day | ORAL | 1 refills | Status: DC | PRN

## 2020-03-24 MED ORDER — MELOXICAM 15 MG PO TABS: 15.0000 mg | ORAL_TABLET | Freq: Every day | ORAL | 1 refills | Status: DC

## 2020-03-24 NOTE — Progress Notes (Signed)
Virtual Visit via Telephone Note Due to current restrictions/limitations of in-office visits due to the COVID-19 pandemic, this scheduled clinical appointment was converted to a telehealth visit  I connected with Terri Duran on 03/24/20 at 9:05 a.m by telephone and verified that I am speaking with the correct person using two identifiers. I am in my office.  The patient is at home.  Only the patient and myself participated in this encounter.  I discussed the limitations, risks, security and privacy concerns of performing an evaluation and management service by telephone and the availability of in person appointments. I also discussed with the patient that there may be a patient responsible charge related to this service. The patient expressed understanding and agreed to proceed.   History of Present Illness: Pt for new pt visit.  No previous PCP. Not on any meds, no chronic med conditions   Pt c/o "problems with my legs." Intermittent numbness and tingling in LT leg x 1 yr.  Sometimes she gets "muscle pain" in RT thigh.  No weakness.  Feels pain in RT leg after walking for 15-20 mins.   Intermittent LBP where back feels stiff. Seen in ER for similar in 2019 and 07/2019.  Given Prednisone which gives relief for a few days. Takes Ibuprofen or Aleve or Tylenol arthritis which helps for a while.  "if I'm at a job where I do a lot of walking and standing, by the end   Has a mole on LT side of neck which is irritated and itches.  Dark brown in color; present x 2 yrs.  It has increased in size.  Would like to have it removed. Observations/Objective:   Assessment and Plan: 1. Neuropathy of left lower extremity Will exam on in person exam. ? Coming from her back  2. Pain in right leg Will check pulses on in person exam - meloxicam (MOBIC) 15 MG tablet; Take 1 tablet (15 mg total) by mouth daily.  Dispense: 30 tablet; Refill: 1 - methocarbamol (ROBAXIN) 500 MG tablet; Take 1 tablet (500 mg  total) by mouth daily as needed for muscle spasms.  Dispense: 30 tablet; Refill: 1  3. Chronic bilateral low back pain without sciatica - meloxicam (MOBIC) 15 MG tablet; Take 1 tablet (15 mg total) by mouth daily.  Dispense: 30 tablet; Refill: 1 - methocarbamol (ROBAXIN) 500 MG tablet; Take 1 tablet (500 mg total) by mouth daily as needed for muscle spasms.  Dispense: 30 tablet; Refill: 1 - DG Lumbar Spine Complete; Future  4. Skin mole - Ambulatory referral to Dermatology   Follow Up Instructions: F/u in 6 wks in person   I discussed the assessment and treatment plan with the patient. The patient was provided an opportunity to ask questions and all were answered. The patient agreed with the plan and demonstrated an understanding of the instructions.   The patient was advised to call back or seek an in-person evaluation if the symptoms worsen or if the condition fails to improve as anticipated.  I provided 16 minutes of non-face-to-face time during this encounter.   Karle Plumber, MD  Pt states she is having numbness in the front of her b/l legs

## 2020-03-25 ENCOUNTER — Ambulatory Visit (HOSPITAL_COMMUNITY)
Admission: RE | Admit: 2020-03-25 | Discharge: 2020-03-25 | Disposition: A | Discharge status: Home / Self Care | Payer: Medicaid Other | Source: Ambulatory Visit | Attending: Internal Medicine | Admitting: Internal Medicine

## 2020-03-25 DIAGNOSIS — M545 Low back pain, unspecified: Secondary | ICD-10-CM

## 2020-03-25 DIAGNOSIS — G8929 Other chronic pain: Secondary | ICD-10-CM | POA: Diagnosis not present

## 2020-03-26 DIAGNOSIS — Z5181 Encounter for therapeutic drug level monitoring: Secondary | ICD-10-CM | POA: Diagnosis not present

## 2020-03-30 DIAGNOSIS — Z419 Encounter for procedure for purposes other than remedying health state, unspecified: Secondary | ICD-10-CM | POA: Diagnosis not present

## 2020-03-30 DIAGNOSIS — Z5181 Encounter for therapeutic drug level monitoring: Secondary | ICD-10-CM | POA: Diagnosis not present

## 2020-03-31 DIAGNOSIS — Z5181 Encounter for therapeutic drug level monitoring: Secondary | ICD-10-CM | POA: Diagnosis not present

## 2020-04-01 DIAGNOSIS — Z5181 Encounter for therapeutic drug level monitoring: Secondary | ICD-10-CM | POA: Diagnosis not present

## 2020-04-02 DIAGNOSIS — Z5181 Encounter for therapeutic drug level monitoring: Secondary | ICD-10-CM | POA: Diagnosis not present

## 2020-04-04 DIAGNOSIS — Z5181 Encounter for therapeutic drug level monitoring: Secondary | ICD-10-CM | POA: Diagnosis not present

## 2020-04-09 DIAGNOSIS — Z5181 Encounter for therapeutic drug level monitoring: Secondary | ICD-10-CM | POA: Diagnosis not present

## 2020-04-17 DIAGNOSIS — Z5181 Encounter for therapeutic drug level monitoring: Secondary | ICD-10-CM | POA: Diagnosis not present

## 2020-04-22 DIAGNOSIS — Z5181 Encounter for therapeutic drug level monitoring: Secondary | ICD-10-CM | POA: Diagnosis not present

## 2020-04-24 DIAGNOSIS — Z5181 Encounter for therapeutic drug level monitoring: Secondary | ICD-10-CM | POA: Diagnosis not present

## 2020-04-27 DIAGNOSIS — Z5181 Encounter for therapeutic drug level monitoring: Secondary | ICD-10-CM | POA: Diagnosis not present

## 2020-04-29 DIAGNOSIS — Z5181 Encounter for therapeutic drug level monitoring: Secondary | ICD-10-CM | POA: Diagnosis not present

## 2020-04-30 DIAGNOSIS — Z419 Encounter for procedure for purposes other than remedying health state, unspecified: Secondary | ICD-10-CM | POA: Diagnosis not present

## 2020-05-18 DIAGNOSIS — Z5181 Encounter for therapeutic drug level monitoring: Secondary | ICD-10-CM | POA: Diagnosis not present

## 2020-05-22 DIAGNOSIS — Z5181 Encounter for therapeutic drug level monitoring: Secondary | ICD-10-CM | POA: Diagnosis not present

## 2020-05-30 DIAGNOSIS — Z419 Encounter for procedure for purposes other than remedying health state, unspecified: Secondary | ICD-10-CM | POA: Diagnosis not present

## 2020-06-03 DIAGNOSIS — Z5181 Encounter for therapeutic drug level monitoring: Secondary | ICD-10-CM | POA: Diagnosis not present

## 2020-06-06 ENCOUNTER — Ambulatory Visit: Payer: Medicaid Other | Admitting: Internal Medicine

## 2020-06-11 DIAGNOSIS — Z5181 Encounter for therapeutic drug level monitoring: Secondary | ICD-10-CM | POA: Diagnosis not present

## 2020-06-28 DIAGNOSIS — D234 Other benign neoplasm of skin of scalp and neck: Secondary | ICD-10-CM | POA: Diagnosis not present

## 2020-06-30 DIAGNOSIS — Z419 Encounter for procedure for purposes other than remedying health state, unspecified: Secondary | ICD-10-CM | POA: Diagnosis not present

## 2020-07-02 DIAGNOSIS — B079 Viral wart, unspecified: Secondary | ICD-10-CM | POA: Diagnosis not present

## 2020-07-30 DIAGNOSIS — Z419 Encounter for procedure for purposes other than remedying health state, unspecified: Secondary | ICD-10-CM | POA: Diagnosis not present

## 2020-08-28 ENCOUNTER — Ambulatory Visit: Payer: Self-pay | Admitting: *Deleted

## 2020-08-28 DIAGNOSIS — G8929 Other chronic pain: Secondary | ICD-10-CM

## 2020-08-28 DIAGNOSIS — M79604 Pain in right leg: Secondary | ICD-10-CM

## 2020-08-28 MED ORDER — MELOXICAM 15 MG PO TABS: 15.0000 mg | ORAL_TABLET | Freq: Every day | ORAL | 0 refills | Status: DC

## 2020-08-28 NOTE — Telephone Encounter (Signed)
C/o left arm and shoulder pain and numbness and tingling sensation in middle of arm. Reports left hand will "lock up " at times. Pain in left arm and shoulder started over a week ago . 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 Some weakness noted in left arm and does drop objects at times. Denies chest pain, difficulty breathing , weakness in left side, no headache, visual disturbances, or other neurological deficits. Patient requested results from previous xray of spine from 03/26/20. No available appt noted until 2/22. Patient requesting medication through the weekend for pain management. Patent reports she did not feel comfortable taking mobic due to side effects related to heart issues. Patient reports she would like to try physical therapy for back pain. Patient requesting to get proof of statement that she attempted to see PCP today for work and no available appt noted. Patient reports she will wait to make earliest appt available in 2/22 until seeing what PCP will recommend for her. Care advise given. Patient verbalized understanding of care advise and to call back or go to [facility] or ED is symptoms worsen.   Reason for Disposition  [1] MODERATE pain (e.g., interferes with normal activities) AND [2] present > 3 days  Answer Assessment - Initial Assessment Questions 1. ONSET: "When did the pain start?"     1 week ago  2. LOCATION: "Where is the pain located?"     Left shoulder radiates threw left arm  3. PAIN: "How bad is the pain?" (Scale 1-10; or mild, moderate, severe)   - MILD (1-3): doesn't interfere with normal activities   - MODERATE (4-7): interferes with normal activities (e.g., work or school) or awakens from sleep   - SEVERE (8-10): excruciating pain, unable to do any normal activities, unable to hold a cup of water     Moderate  4. WORK OR EXERCISE: "Has there been any recent work or exercise that involved this part of the body?"     Works as a Higher education careers adviser 5. CAUSE: "What do you think is causing the arm pain?"     unknown 6. OTHER SYMPTOMS: "Do you have any other symptoms?" (e.g., neck pain, swelling, rash, fever, numbness, weakness)     Pain in left shoulder, numbness in left arm and tingling, weakness. 7. PREGNANCY: "Is there any chance you are pregnant?" "When was your last menstrual period?"     na  Protocols used: ARM PAIN-A-AH

## 2020-08-28 NOTE — Addendum Note (Signed)
Addended by: Hoy Register on: 08/28/2020 05:05 PM   Modules accepted: Orders

## 2020-08-28 NOTE — Telephone Encounter (Signed)
Will forward to covering provider.

## 2020-08-28 NOTE — Telephone Encounter (Addendum)
She can be scheduled with any Clinician who has an opening or the mobile unit as this requires a visit. I have refilled Meloxicam which Dr Laural Benes previously prescribed for her.

## 2020-08-28 NOTE — Telephone Encounter (Signed)
Please follow up with patient.

## 2020-08-29 NOTE — Telephone Encounter (Signed)
Scheduled patient for a visit with Dr. Laural Benes Tuesday Jan 18th at 2:10. I advised patient per nurse to go to the UC/ED if her pain worsened. Patient asked if medication had been sent and I let her know that Meloxicam was sent to [facility].  Patient stated that she had requested a different medication for the same symptoms as she felt that was not working well. I let patient know that Dr. Laural Benes was not in today, that we are closed on Monday, and that it's likely Dr. Laural Benes would not receive the message until Tuesday. Patient stated she understood.

## 2020-08-29 NOTE — Telephone Encounter (Signed)
Could you please contact pt and see if she is able to do a virtual visit with wallace or amy today if available

## 2020-08-30 DIAGNOSIS — Z419 Encounter for procedure for purposes other than remedying health state, unspecified: Secondary | ICD-10-CM | POA: Diagnosis not present

## 2020-09-02 NOTE — Telephone Encounter (Signed)
Will forward to pcp

## 2020-09-16 ENCOUNTER — Ambulatory Visit: Payer: Medicaid Other | Admitting: Internal Medicine

## 2020-09-29 ENCOUNTER — Ambulatory Visit: Payer: Medicaid Other | Admitting: Obstetrics

## 2020-09-30 DIAGNOSIS — Z419 Encounter for procedure for purposes other than remedying health state, unspecified: Secondary | ICD-10-CM | POA: Diagnosis not present

## 2020-10-02 ENCOUNTER — Telehealth: Payer: Self-pay | Admitting: Internal Medicine

## 2020-10-02 NOTE — Telephone Encounter (Signed)
Will forward to provider  

## 2020-10-02 NOTE — Telephone Encounter (Signed)
Patient called in to inform that she tested positive for COVID. Patient requested an antibiotic or any advice. Please follow up.

## 2020-10-05 DIAGNOSIS — Z20822 Contact with and (suspected) exposure to covid-19: Secondary | ICD-10-CM | POA: Diagnosis not present

## 2020-10-06 ENCOUNTER — Telehealth (INDEPENDENT_AMBULATORY_CARE_PROVIDER_SITE_OTHER): Payer: Medicaid Other | Admitting: Physician Assistant

## 2020-10-06 ENCOUNTER — Other Ambulatory Visit: Payer: Self-pay

## 2020-10-06 ENCOUNTER — Encounter: Payer: Self-pay | Admitting: Physician Assistant

## 2020-10-06 DIAGNOSIS — Z20822 Contact with and (suspected) exposure to covid-19: Secondary | ICD-10-CM

## 2020-10-06 DIAGNOSIS — Z8616 Personal history of COVID-19: Secondary | ICD-10-CM

## 2020-10-06 LAB — POCT INFLUENZA A/B
Influenza A, POC: NEGATIVE
Influenza B, POC: NEGATIVE

## 2020-10-06 LAB — POC COVID19 BINAXNOW: SARS Coronavirus 2 Ag: NEGATIVE

## 2020-10-06 NOTE — Progress Notes (Signed)
Patient verified DOB Patient complains of productive cough with yellow sputum. Patient denies any HA or Body aches. Patient denies fevers N/V/ diarrhea. Patient has taste and smell along with appetite. Patient denies any exposure. Patient complains of symptoms beginning last Thursday. Patient request a alternate for arthritis pain.

## 2020-10-06 NOTE — Progress Notes (Signed)
Established Patient Office Visit  Subjective:  Patient ID: Terri Duran, female    DOB: April 21, 1976  Age: 45 y.o. MRN: [id number]  CC:  Chief Complaint  Patient presents with   Covid Positive   Virtual Visit via Telephone Note  I connected with Brand Males on 10/06/20 at  9:30 AM EST by telephone and verified that I am speaking with the correct person using two identifiers.  Location: Patient: Home Provider: Primary Care at [facility]    I discussed the limitations, risks, security and privacy concerns of performing an evaluation and management service by telephone and the availability of in person appointments. I also discussed with the patient that there may be a patient responsible charge related to this service. The patient expressed understanding and agreed to proceed.   History of Present Illness: States that she started having Covid-like symptoms on Thursday, January 27th, states that she had a positive Covid test on Sunday, September 28, 2020.  Reports that most of her symptoms have resolved, still will have an occasional productive cough with yellow sputum.  Reports that she has been taking a multivitamin, eating and drinking well.  Reports that her work requires a negative Covid test for her to be able to return.  Denies any previous COVID vaccines  Observations/Objective: Medical history and current medications reviewed, no physical exam completed   Past Medical History:  Diagnosis Date   BV (bacterial vaginosis)    GERD (gastroesophageal reflux disease)     Past Surgical History:  Procedure Laterality Date   DILATION AND CURETTAGE OF UTERUS     TUBAL LIGATION      Family History  Problem Relation Age of Onset   Diabetes Mother    Hypertension Mother    Hyperlipidemia Mother     Social History   Socioeconomic History   Marital status: Single    Spouse name: Not on file   Number of children: Not on file   Years of education: Not on file    Highest education level: Not on file  Occupational History   Not on file  Tobacco Use   Smoking status: Current Every Day Smoker    Packs/day: 0.50   Smokeless tobacco: Never Used  Vaping Use   Vaping Use: Never used  Substance and Sexual Activity   Alcohol use: No    Alcohol/week: 0.0 standard drinks    Comment: occassional   Drug use: No   Sexual activity: Yes    Partners: Male    Birth control/protection: Surgical    Comment: Tubal Ligation   Other Topics Concern   Not on file  Social History Narrative   Not on file   Social Determinants of Health   Financial Resource Strain: Not on file  Food Insecurity: Not on file  Transportation Needs: Not on file  Physical Activity: Not on file  Stress: Not on file  Social Connections: Not on file  Intimate Partner Violence: Not on file    Outpatient Medications Prior to Visit  Medication Sig Dispense Refill   meloxicam (MOBIC) 15 MG tablet Take 1 tablet (15 mg total) by mouth daily. 30 tablet 0   methocarbamol (ROBAXIN) 500 MG tablet Take 1 tablet (500 mg total) by mouth daily as needed for muscle spasms. 30 tablet 1   No facility-administered medications prior to visit.    No Known Allergies  ROS Review of Systems  Constitutional: Negative for chills, fatigue and fever.  HENT: Positive for congestion.  Eyes: Negative.   Respiratory: Positive for cough. Negative for shortness of breath and wheezing.   Cardiovascular: Negative for chest pain.  Gastrointestinal: Negative for diarrhea, nausea and vomiting.  Endocrine: Negative.   Genitourinary: Negative.   Musculoskeletal: Negative for myalgias.  Skin: Negative.   Allergic/Immunologic: Negative.   Neurological: Negative for headaches.  Hematological: Negative.   Psychiatric/Behavioral: Negative.       Objective:     LMP 09/27/2020  Wt Readings from Last 3 Encounters:  03/19/19 278 lb 4.8 oz (126.2 kg)  11/10/17 248 lb (112.5 kg)  10/26/17 248 lb  (112.5 kg)     Health Maintenance Due  Topic Date Due   Hepatitis C Screening  Never done   COVID-19 Vaccine (1) Never done   HIV Screening  Never done   MAMMOGRAM  Never done   INFLUENZA VACCINE  Never done    There are no preventive care reminders to display for this patient.  Lab Results  Component Value Date   TSH 1.007 03/05/2015   Lab Results  Component Value Date   WBC 6.4 11/12/2016   HGB 10.2 (L) 11/12/2016   HCT 31.9 (L) 11/12/2016   MCV 83.5 11/12/2016   PLT 268 11/12/2016   Lab Results  Component Value Date   NA 141 03/05/2015   K 4.2 03/05/2015   CO2 25 03/05/2015   GLUCOSE 91 03/05/2015   BUN 14 03/05/2015   CREATININE 0.84 03/05/2015   BILITOT 0.2 03/05/2015   ALKPHOS 49 03/05/2015   AST 15 03/05/2015   ALT 14 03/05/2015   PROT 6.8 03/05/2015   ALBUMIN 3.7 03/05/2015   CALCIUM 9.2 03/05/2015   Lab Results  Component Value Date   CHOL 154 03/05/2015   Lab Results  Component Value Date   HDL 62 03/05/2015   No results found for: [facility] Lab Results  Component Value Date   TRIG 64 03/05/2015   No results found for: CHOLHDL Lab Results  Component Value Date   HGBA1C 5.7 (H) 03/05/2015      Assessment & Plan:   Problem List Items Addressed This Visit   None   Visit Diagnoses    History of COVID-19    -  Primary     Assessment and Plan: 1. History of COVID-19 Patient to report to Primary Care at [facility] for rapid Covid testing.  Work note completed on patient's behalf.  Patient education given on importance and efficacy of Covid vaccines.   Follow Up Instructions:    I discussed the assessment and treatment plan with the patient. The patient was provided an opportunity to ask questions and all were answered. The patient agreed with the plan and demonstrated an understanding of the instructions.   The patient was advised to call back or seek an in-person evaluation if the symptoms worsen or if the condition fails to  improve as anticipated.  I provided 22 minutes of non-face-to-face time during this encounter.    No orders of the defined types were placed in this encounter.   Follow-up: Return if symptoms worsen or fail to improve.    Loraine Grip Mayers, PA-C

## 2020-10-06 NOTE — Patient Instructions (Signed)
I am thankful that you are feeling better.  I encourage you to get your Covid vaccines.  Below is information on where you can receive them  COVID-19 Vaccine Information can be found at: [email or web address].uk For questions related to vaccine distribution or appointments, please email vaccine@ .com or call [phone].    Please let us know if there is anything else we can do for you  Kennieth Rad, PA-C Physician Assistant [place] [email or web address]   How to Wear and Take Off Your Mask How to put on and wear your mask correctly  Wash your hands or use hand sanitizer before putting on your mask  Put it over your face and mouth  Be sure your mask fits snugly against the sides of your face and under your chin  Make sure you can breathe easily Wear a mask to protect yourself and others  Wear a mask over your nose and mouth to help prevent getting and spreading COVID-19  Wear a mask in public settings, especially when you cannot stay six feet apart from people who don't live with you How to take off your mask  Untie the strings behind your head or stretch the ear loops  Handle only by the ear loops or ties  Fold outside corners together  Wash hands immediately after removing Other ways to protect yourself  Stay at least 6 feet away from others  Avoid crowds and places with poor ventilation  Wash your hands often  Get a vaccine when it is offered [email or web address] 11/29/2019 This information is not intended to replace advice given to you by your health care provider. Make sure you discuss any questions you have with your health care provider. Document Revised: 06/30/2020 Document Reviewed: 06/30/2020 Elsevier Patient Education  2021 [employer].

## 2020-10-28 DIAGNOSIS — Z419 Encounter for procedure for purposes other than remedying health state, unspecified: Secondary | ICD-10-CM | POA: Diagnosis not present

## 2020-11-03 ENCOUNTER — Other Ambulatory Visit: Payer: Self-pay

## 2020-11-03 ENCOUNTER — Ambulatory Visit: Payer: Medicaid Other | Attending: Internal Medicine | Admitting: Internal Medicine

## 2020-11-03 DIAGNOSIS — M5136 Other intervertebral disc degeneration, lumbar region: Secondary | ICD-10-CM

## 2020-11-03 DIAGNOSIS — M545 Low back pain, unspecified: Secondary | ICD-10-CM | POA: Diagnosis not present

## 2020-11-03 DIAGNOSIS — Z23 Encounter for immunization: Secondary | ICD-10-CM

## 2020-11-03 DIAGNOSIS — M51369 Other intervertebral disc degeneration, lumbar region without mention of lumbar back pain or lower extremity pain: Secondary | ICD-10-CM

## 2020-11-03 DIAGNOSIS — M79604 Pain in right leg: Secondary | ICD-10-CM | POA: Diagnosis not present

## 2020-11-03 DIAGNOSIS — G8929 Other chronic pain: Secondary | ICD-10-CM | POA: Diagnosis not present

## 2020-11-03 MED ORDER — METHOCARBAMOL 500 MG PO TABS: 500.0000 mg | ORAL_TABLET | Freq: Every day | ORAL | 3 refills | Status: DC | PRN

## 2020-11-03 NOTE — Progress Notes (Signed)
Virtual Visit via Telephone Note  I connected with Brand Males on 11/03/20 at 3:51 p.m by telephone and verified that I am speaking with the correct person using two identifiers.  Location: Patient: home Provider: office The patient and myself participated in this encounter. I discussed the limitations, risks, security and privacy concerns of performing an evaluation and management service by telephone and the availability of in person appointments. I also discussed with the patient that there may be a patient responsible charge related to this service. The patient expressed understanding and agreed to proceed.   History of Present Illness: Patient with history of chronic low back pain, Covid infection in February 2022.Marland Kitchen  Last evaluated by me 02/2020.  03/24/20:   Pt c/o "problems with my legs." Intermittent numbness and tingling in LT leg x 1 yr.  Sometimes she gets "muscle pain" in RT thigh.  No weakness.  Feels pain in RT leg after walking for 15-20 mins.   Intermittent LBP where back feels stiff. Seen in ER for similar in 2019 and 07/2019.  Given Prednisone which gives relief for a few days. Takes Ibuprofen or Aleve or Tylenol arthritis which helps for a while.  "if I'm at a job where I do a lot of walking and standing, by the end   Today: Still having the issue with stiffness in lower back and pain RT thigh area.  Stiffness is worse during weather changes and with prolonged sitting or laying down. Had x-rays lumbar spine revealed grade 1 anterior listhesis of L4-5 with severe degenerative disc disease at this level.  Patient is requesting refill on Robaxin.  She still has some of the meloxicam and finds it helpful.  She also requests referral to physical therapy for the lower back.  HM: Does not want flu vaccine.  She has not had the COVID-19 vaccine as yet.  She had Covid infection last month. Outpatient Encounter Medications as of 11/03/2020  Medication Sig  . meloxicam (MOBIC) 15  MG tablet Take 1 tablet (15 mg total) by mouth daily.  . methocarbamol (ROBAXIN) 500 MG tablet Take 1 tablet (500 mg total) by mouth daily as needed for muscle spasms.  . [DISCONTINUED] hydrochlorothiazide (HYDRODIURIL) 25 MG tablet Take 1 tablet (25 mg total) by mouth daily.   No facility-administered encounter medications on file as of 11/03/2020.      Observations/Objective: No direct observation done as this was a telephone encounter.  Assessment and Plan: 1. Degenerative disc disease, lumbar - Ambulatory referral to Physical Therapy  2. Chronic bilateral low back pain without sciatica - methocarbamol (ROBAXIN) 500 MG tablet; Take 1 tablet (500 mg total) by mouth daily as needed for muscle spasms.  Dispense: 30 tablet; Refill: 3  3. Pain in right leg - methocarbamol (ROBAXIN) 500 MG tablet; Take 1 tablet (500 mg total) by mouth daily as needed for muscle spasms.  Dispense: 30 tablet; Refill: 3  4. Need for COVID-19 vaccine Now that she is recovered from the COVID-19 infection, I have encouraged her to get the COVID-19 vaccine.   Follow Up Instructions: As needed   I discussed the assessment and treatment plan with the patient. The patient was provided an opportunity to ask questions and all were answered. The patient agreed with the plan and demonstrated an understanding of the instructions.   The patient was advised to call back or seek an in-person evaluation if the symptoms worsen or if the condition fails to improve as anticipated.  I provided 6  minutes of non-face-to-face time during this encounter.   Karle Plumber, MD

## 2020-11-25 ENCOUNTER — Encounter: Payer: Self-pay | Admitting: Physical Therapy

## 2020-11-25 ENCOUNTER — Other Ambulatory Visit: Payer: Self-pay

## 2020-11-25 ENCOUNTER — Ambulatory Visit: Payer: Medicaid Other | Attending: Internal Medicine | Admitting: Physical Therapy

## 2020-11-25 DIAGNOSIS — M5416 Radiculopathy, lumbar region: Secondary | ICD-10-CM | POA: Insufficient documentation

## 2020-11-25 DIAGNOSIS — R2689 Other abnormalities of gait and mobility: Secondary | ICD-10-CM | POA: Insufficient documentation

## 2020-11-25 DIAGNOSIS — M62830 Muscle spasm of back: Secondary | ICD-10-CM | POA: Diagnosis not present

## 2020-11-25 DIAGNOSIS — G8929 Other chronic pain: Secondary | ICD-10-CM | POA: Diagnosis not present

## 2020-11-25 DIAGNOSIS — M5441 Lumbago with sciatica, right side: Secondary | ICD-10-CM | POA: Diagnosis not present

## 2020-11-26 NOTE — Therapy (Signed)
[place], [place], [address] Phone: [phone]   Fax:  [phone]  Physical Therapy Evaluation  Patient Details  Name: Terri Duran MRN: [id number] Date of Birth: 03-13-1976 Referring Provider (PT): Dr Neita Garnet   Encounter Date: 11/25/2020   PT End of Session - 11/26/20 0840    Visit Number 1    Number of Visits 12    Date for PT Re-Evaluation 01/07/21    Authorization Type [employer]    PT Start Time 2122    PT Stop Time 1630    PT Time Calculation (min) 45 min           Past Medical History:  Diagnosis Date  . BV (bacterial vaginosis)   . GERD (gastroesophageal reflux disease)     Past Surgical History:  Procedure Laterality Date  . DILATION AND CURETTAGE OF UTERUS    . TUBAL LIGATION      There were no vitals filed for this visit.    Subjective Assessment - 11/25/20 1551    Subjective Patient has had right sided low back pain for about a year that radiates into her right leg. hse had an incdent about 5 years ago where she fell. She had pain after that but the worst pain began a year ago.    Pertinent History Low Back Pain    Diagnostic tests X-ray : severe    Patient Stated Goals to have less pain    Currently in Pain? Yes    Pain Score 8     Pain Location Back    Pain Orientation Right    Pain Descriptors / Indicators Aching    Pain Type Chronic pain    Pain Onset More than a month ago    Pain Frequency Constant    Aggravating Factors  weather, laying down too long makes her stiff    Pain Relieving Factors nothing    Effect of Pain on Daily Activities difficulty perfroming daily activity              [facility] PT Assessment - 11/26/20 0001      Assessment   Medical Diagnosis Right Lower Back Pain w/ radiculopathy    Referring Provider (PT) Dr Neita Garnet    Onset Date/Surgical Date --   >1 year prior   Hand Dominance Right    Next MD Visit Nothing  scheduled    Prior Therapy none      Precautions   Precautions None      Restrictions   Weight Bearing Restrictions No      Balance Screen   Has the patient fallen in the past 6 months No    Has the patient had a decrease in activity level because of a fear of falling?  No    Is the patient reluctant to leave their home because of a fear of falling?  No      Home Environment   Additional Comments 12 steps into the house that hurt her back      Prior Function   Level of Independence Independent    Vocation Full time employment    Leisure walk the track      Cognition   Overall Cognitive Status Within Functional Limits for tasks assessed    Attention Focused    Focused Attention Appears intact    Awareness Appears intact    Problem Solving Appears intact    Executive Function Reasoning  Observation/Other Assessments   Observations in sitting shifts to the left. no shift noted in standing; has to move frequently to get comfortable    Focus on Therapeutic Outcomes (FOTO)  mediciad      Sensation   Additional Comments numnbess and tingling into both      Coordination   Gross Motor Movements are Fluid and Coordinated Yes    Fine Motor Movements are Fluid and Coordinated Yes      AROM   AROM Assessment Site Lumbar    Lumbar Flexion 38    Lumbar Extension unable to extend past nrutral    Lumbar - Right Side Bend no pain    Lumbar - Left Side Bend no pain    Lumbar - Right Rotation no pain    Lumbar - Left Rotation no pain      PROM   Overall PROM Comments unable to assess hip motion 2nd to      Strength   Strength Assessment Site Hip;Knee    Right/Left Hip Left;Right    Right Hip Flexion 4/5    Right Hip ABduction 4/5    Right Hip ADduction 5/5    Left Hip Flexion 4+/5    Left Hip ABduction 4+/5    Left Hip ADduction 5/5      Palpation   Spinal mobility unable to assess 2nd inability to lie on he sotmach and increased pain from lying on her back     Palpation comment significant tenderness to palpation in gluteal and lower back      Special Tests   Other special tests unable to perfrom      Transfers   Comments slow transfer from sit to stand      Ambulation/Gait   Gait Comments lateral movement bilateral with gait; slightly flexed trunk                      Objective measurements completed on examination: See above findings.       [place] Adult PT Treatment/Exercise - 11/26/20 0001      Bed Mobility   Bed Mobility --   reviewed proper log roll     Posture/Postural Control   Posture Comments rounded shoulders; sits with slight trunk flexion      Lumbar Exercises: Stretches   Passive Hamstring Stretch Limitations reviewed in sitting; encouraged patient to do when she is done driving 9J18 sec hold mod cuing for posture    Other Lumbar Stretch Exercise reviewed self trigger point release with tennis ball                  PT Education - 11/26/20 0842    Education Details reviewed HEp and syptom management    Person(s) Educated Patient    Methods Explanation;Tactile cues;Verbal cues;Demonstration    Comprehension Verbalized understanding;Verbal cues required;Returned demonstration;Need further instruction            PT Short Term Goals - 11/26/20 0816      PT SHORT TERM GOAL #1   Title Patient will lei supine for 5 min without a significant increase in pain    Baseline can not lie supine without significant pain    Time 3    Period Weeks    Status New    Target Date 12/17/20      PT SHORT TERM GOAL #2   Title Patient will increase lumbar flexion by 15 degrees without pain    Baseline 32 degrees with signifcant pain  Time 3    Period Weeks    Status New    Target Date 12/17/20      PT SHORT TERM GOAL #3   Title Patient be indepdnent with basic stretching and self soft tissue mobilization program to allow for more agressive strengthening and endurance training    Baseline has no HEP     Time 3    Period Weeks    Status New    Target Date 12/17/20             PT Long Term Goals - 11/26/20 0819      PT LONG TERM GOAL #1   Title Patient will go up /down 12 steps without pain in order to get into her home    Baseline significant pain with steps    Time 6    Period Weeks    Status New    Target Date 01/07/21      PT LONG TERM GOAL #2   Title Patient will be able to walk on a track for exercise without pain    Baseline was walking on atrack for exercises prior to exacerbation of pain. She is unable at this time.    Time 6    Period Weeks    Status New    Target Date 01/07/21                  Plan - 11/26/20 0759    Clinical Impression Statement Patient is a 45 year old female with right sideed low back pain that radiates inot her right leg and thigh. She alos reports bilateral nubness to her knees at times. her x-ray shows severe debgeneration at L4-L5. She is very inflammed at this time. Therapy was sunable to assess hip mobility or spine mobility becxause she was unable to lie supine or prone. She has significant spasming in her right lumbar paraspinals and gluteal. She has limited ability to flex and extened. She has limited right hip flexor strength and right hip abduction. 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 She drives for a living. She was given seated stretching and self trigger point release to decrease pain. She would benefit from skilled therapy to improve positional tolerance and ability to walk.    Personal Factors and Comorbidities Comorbidity 1;Comorbidity 2;Profession    Comorbidities neuropathy of left leg, Obesity    Examination-Activity Limitations Bed Mobility;Carry;Dressing;Lift;Locomotion Level;Sleep;Squat;Stairs    Examination-Participation Restrictions Cleaning    Stability/Clinical Decision Making Evolving/Moderate complexity   progressive limitation of positional tolerance   Clinical Decision Making Moderate    Rehab Potential Good     PT Frequency 2x / week    PT Duration 6 weeks    PT Treatment/Interventions ADLs/Self Care Home Management;Cryotherapy;Electrical Stimulation;DME Instruction;Ultrasound;Iontophoresis 4mg /ml Dexamethasone;Gait training;Stair training;Functional mobility training;Therapeutic activities;Therapeutic exercise;Patient/family education;Manual techniques;Passive range of motion;Dry needling;Splinting;Taping;Spinal Manipulations;Joint Manipulations    PT Next Visit Plan If patient is unable to lie supine consider seated ball roll out; seated manual therapy to lumbar spine if tolerated; therapy reviewed TA breathing; progress to seated PPT; consider sitting clamshell or UE core stabilization exercises; modalities PRN,    PT Home Exercise Plan abdominal breathing;; hamstring stretch, tennis ball trigger point releaseAccess Code: [email or web address]  URL: [email or web address]  Date: 11/26/2020  Prepared by: Carolyne Littles    Exercises  Standing Glute Med Mobilization with Wyvonnia Lora on Wall - 3 x daily - 7 x weekly - 3 sets - 10 reps  Seated Diaphragmatic Breathing - 3 x daily - 7 x  weekly - 2 sets - 5 reps  Seated Hamstring Stretch - 1 x daily - 7 x weekly - 3 sets - 3 reps - 20sec hold    Consulted and Agree with Plan of Care Patient           Patient will benefit from skilled therapeutic intervention in order to improve the following deficits and impairments:  Abnormal gait,Difficulty walking,Obesity,Pain,Decreased mobility,Decreased strength,Postural dysfunction,Decreased activity tolerance,Increased muscle spasms,Decreased endurance  Visit Diagnosis: Chronic right-sided low back pain with right-sided sciatica - Plan: PT plan of care cert/re-cert  Radiculopathy, lumbar region - Plan: PT plan of care cert/re-cert  Muscle spasm of back - Plan: PT plan of care cert/re-cert  Other abnormalities of gait and mobility - Plan: PT plan of care cert/re-cert     Problem List Patient Active Problem List    Diagnosis Date Noted  . Neuropathy of left lower extremity 03/24/2020  . Pain in right leg 03/24/2020  . Chronic bilateral low back pain without sciatica 03/24/2020  . Morbid obesity ([place]) 03/05/2015  . Family history of diabetes mellitus (DM) 11/21/2013  . High BMI 11/21/2013  . Smoker 11/21/2013    Carney Living PT DPT  11/26/2020, 8:43 AM  [facility] [address] [place], [place], [address] Phone: [phone]   Fax:  [phone]  Name: Terri Duran MRN: [id number] Date of Birth: 12-29-1975

## 2020-11-26 NOTE — Patient Instructions (Signed)
Access Code: [email or web address] URL: [email or web address] Date: 11/26/2020 Prepared by: Carolyne Littles  Exercises Standing Glute Med Mobilization with Wyvonnia Lora on Wall - 3 x daily - 7 x weekly - 3 sets - 10 reps Seated Diaphragmatic Breathing - 3 x daily - 7 x weekly - 2 sets - 5 reps Seated Hamstring Stretch - 1 x daily - 7 x weekly - 3 sets - 3 reps - 20sec hold

## 2020-11-28 DIAGNOSIS — Z419 Encounter for procedure for purposes other than remedying health state, unspecified: Secondary | ICD-10-CM | POA: Diagnosis not present

## 2020-12-04 ENCOUNTER — Telehealth: Payer: Self-pay

## 2020-12-04 ENCOUNTER — Ambulatory Visit: Payer: Medicaid Other | Attending: Internal Medicine

## 2020-12-04 NOTE — Telephone Encounter (Signed)
PT called patient who stated she forgot about her appointment today. PT reminded her of attendance policy and the schedule of her next appointment.

## 2020-12-10 ENCOUNTER — Ambulatory Visit: Payer: Medicaid Other

## 2020-12-28 DIAGNOSIS — Z419 Encounter for procedure for purposes other than remedying health state, unspecified: Secondary | ICD-10-CM | POA: Diagnosis not present

## 2021-01-13 ENCOUNTER — Ambulatory Visit: Payer: Medicaid Other | Admitting: Obstetrics

## 2021-01-28 DIAGNOSIS — Z419 Encounter for procedure for purposes other than remedying health state, unspecified: Secondary | ICD-10-CM | POA: Diagnosis not present

## 2021-02-27 DIAGNOSIS — Z419 Encounter for procedure for purposes other than remedying health state, unspecified: Secondary | ICD-10-CM | POA: Diagnosis not present

## 2021-03-20 DIAGNOSIS — Z20822 Contact with and (suspected) exposure to covid-19: Secondary | ICD-10-CM | POA: Diagnosis not present

## 2021-03-30 DIAGNOSIS — Z419 Encounter for procedure for purposes other than remedying health state, unspecified: Secondary | ICD-10-CM | POA: Diagnosis not present

## 2021-04-24 ENCOUNTER — Ambulatory Visit: Payer: Medicaid Other | Admitting: Obstetrics

## 2021-04-24 NOTE — Progress Notes (Deleted)
Annual exam Last PAP 03/19/19, normal No Mammo on file

## 2021-04-30 DIAGNOSIS — Z419 Encounter for procedure for purposes other than remedying health state, unspecified: Secondary | ICD-10-CM | POA: Diagnosis not present

## 2021-05-25 ENCOUNTER — Ambulatory Visit: Payer: Medicaid Other | Admitting: Obstetrics

## 2021-05-30 DIAGNOSIS — Z419 Encounter for procedure for purposes other than remedying health state, unspecified: Secondary | ICD-10-CM | POA: Diagnosis not present

## 2021-06-30 DIAGNOSIS — Z419 Encounter for procedure for purposes other than remedying health state, unspecified: Secondary | ICD-10-CM | POA: Diagnosis not present

## 2021-07-29 IMAGING — DX DG HAND COMPLETE 3+V*L*
3 series · 3 of 3 positions shown · non-contrast
Comparison: None.

CLINICAL DATA: Laceration from glass. Rule out foreign body
presence.

EXAM:
LEFT HAND - COMPLETE 3+ VIEW

[hand pa]
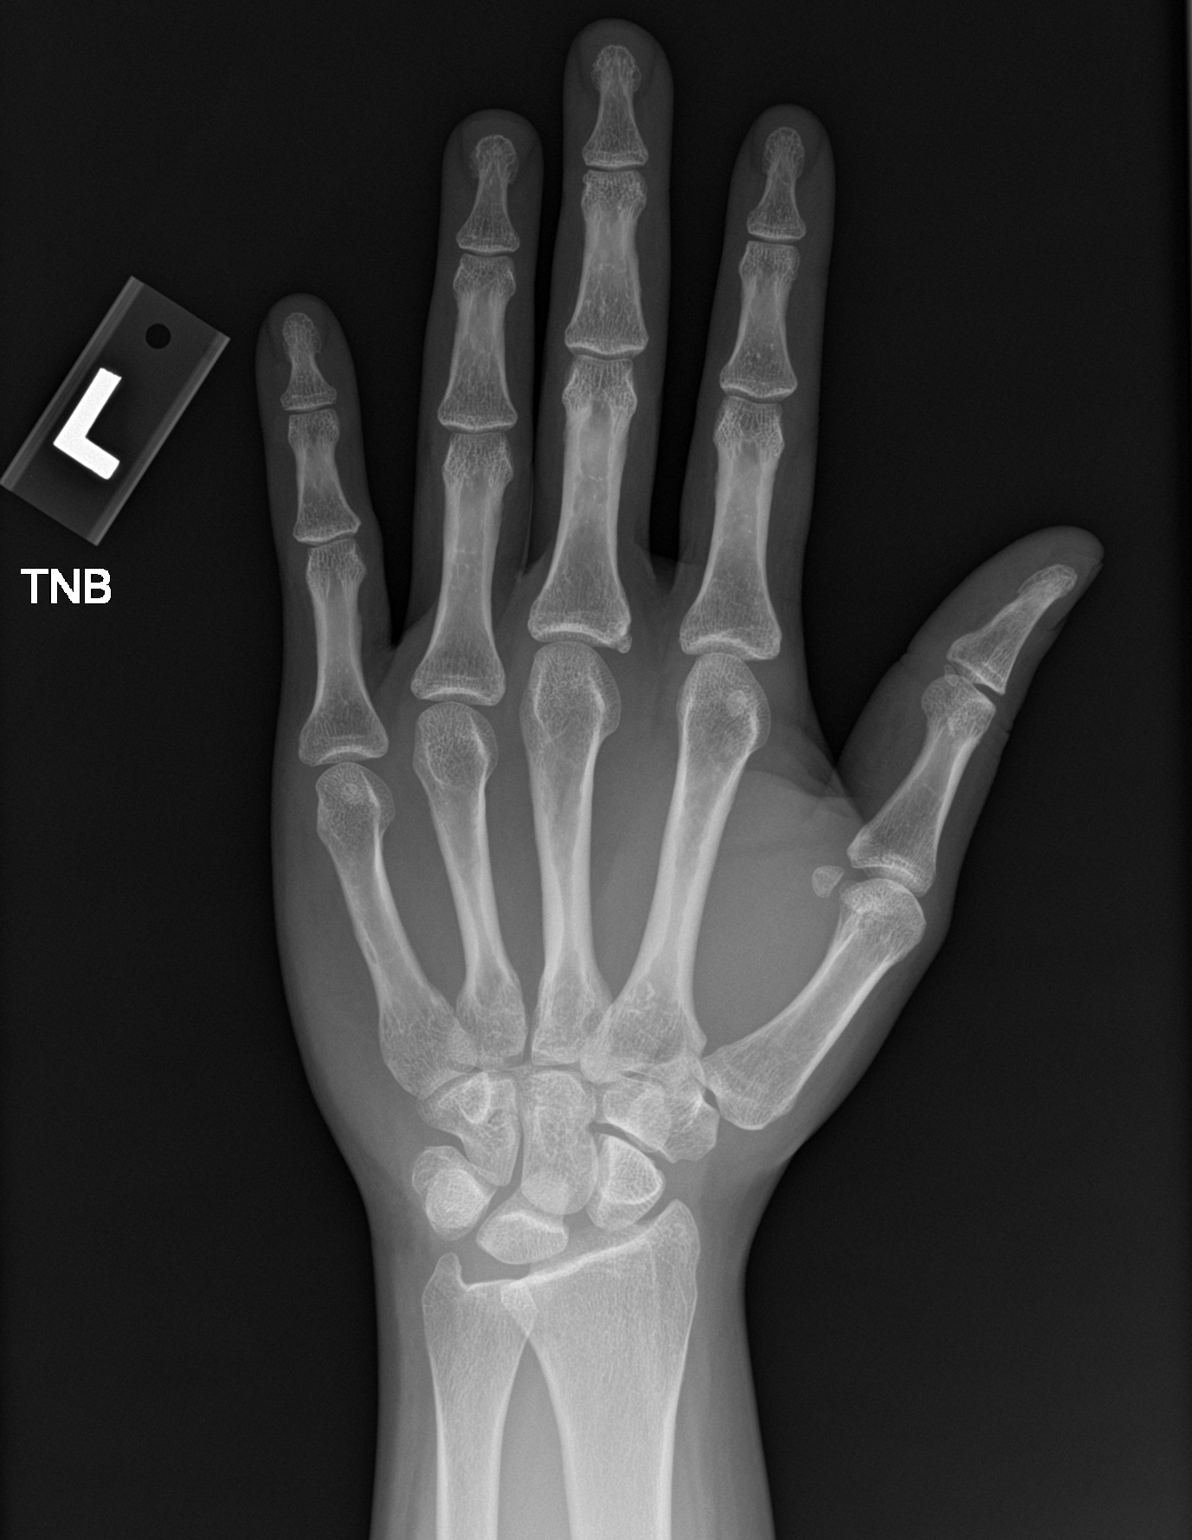

[hand obl]
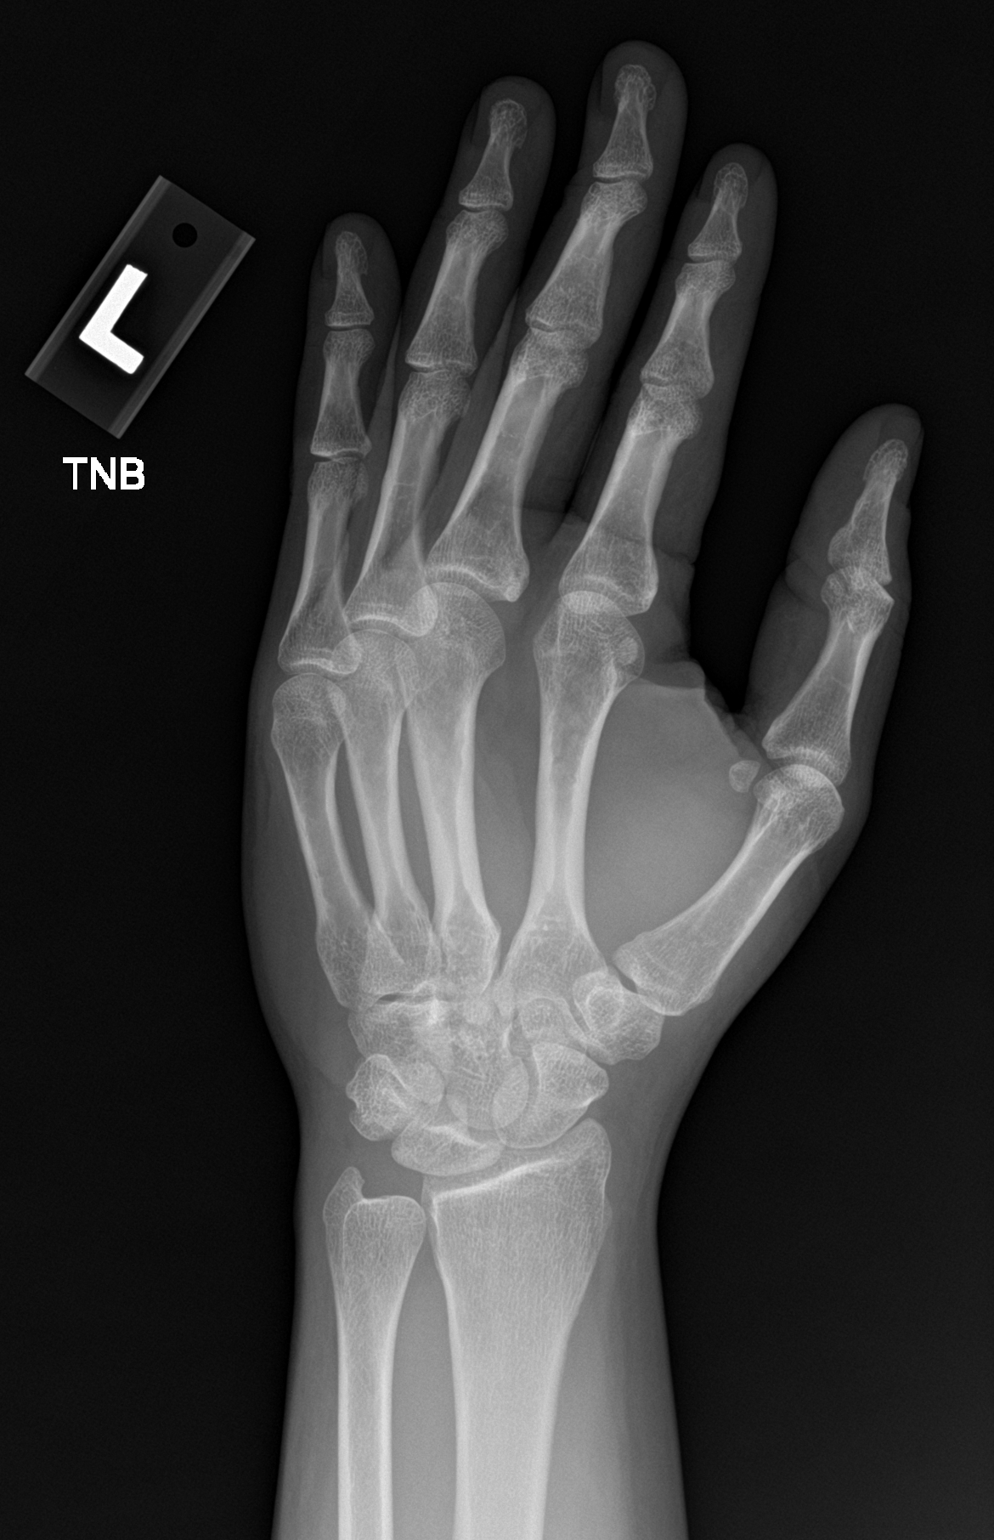

[hand lat]
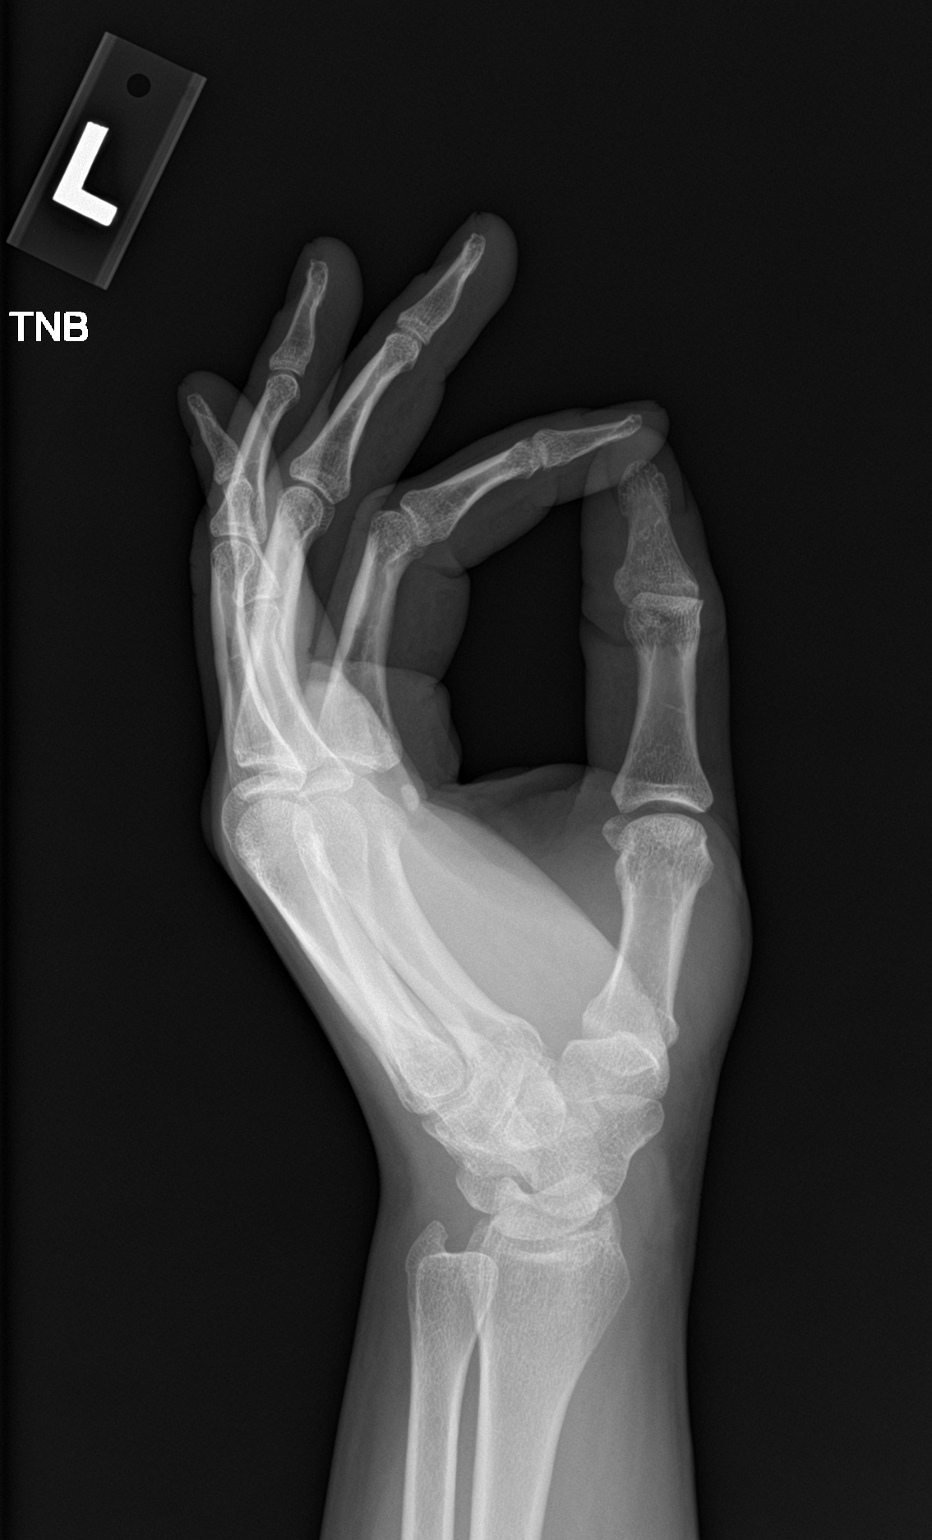

[3 of 3 positions shown; findings below may reference images not displayed]

FINDINGS: Osseous alignment is normal. No fracture line or displaced fracture
fragment seen. No radiodense foreign body appreciated within the
soft tissues.
IMPRESSION: Negative. No radiodense foreign body appreciated.

## 2021-07-30 DIAGNOSIS — Z419 Encounter for procedure for purposes other than remedying health state, unspecified: Secondary | ICD-10-CM | POA: Diagnosis not present

## 2021-08-04 ENCOUNTER — Other Ambulatory Visit (HOSPITAL_COMMUNITY)
Admission: RE | Admit: 2021-08-04 | Discharge: 2021-08-04 | Disposition: A | Discharge status: Home / Self Care | Payer: Medicaid Other | Source: Ambulatory Visit | Attending: Obstetrics | Admitting: Obstetrics

## 2021-08-04 ENCOUNTER — Ambulatory Visit (HOSPITAL_BASED_OUTPATIENT_CLINIC_OR_DEPARTMENT_OTHER)
Admission: RE | Admit: 2021-08-04 | Discharge: 2021-08-04 | Disposition: A | Discharge status: Home / Self Care | Payer: Medicaid Other | Source: Ambulatory Visit | Attending: Obstetrics | Admitting: Obstetrics

## 2021-08-04 ENCOUNTER — Ambulatory Visit (INDEPENDENT_AMBULATORY_CARE_PROVIDER_SITE_OTHER): Payer: Medicaid Other | Admitting: Obstetrics

## 2021-08-04 ENCOUNTER — Encounter (HOSPITAL_BASED_OUTPATIENT_CLINIC_OR_DEPARTMENT_OTHER): Payer: Self-pay | Admitting: Radiology

## 2021-08-04 ENCOUNTER — Encounter: Payer: Self-pay | Admitting: Obstetrics

## 2021-08-04 ENCOUNTER — Other Ambulatory Visit: Payer: Self-pay

## 2021-08-04 VITALS — BP 120/80 | HR 90 | Ht 65.0 in

## 2021-08-04 DIAGNOSIS — N926 Irregular menstruation, unspecified: Secondary | ICD-10-CM

## 2021-08-04 DIAGNOSIS — Z1239 Encounter for other screening for malignant neoplasm of breast: Secondary | ICD-10-CM | POA: Diagnosis not present

## 2021-08-04 DIAGNOSIS — Z6841 Body Mass Index (BMI) 40.0 and over, adult: Secondary | ICD-10-CM

## 2021-08-04 DIAGNOSIS — Z01419 Encounter for gynecological examination (general) (routine) without abnormal findings: Secondary | ICD-10-CM | POA: Insufficient documentation

## 2021-08-04 DIAGNOSIS — N898 Other specified noninflammatory disorders of vagina: Secondary | ICD-10-CM | POA: Diagnosis not present

## 2021-08-04 DIAGNOSIS — Z1231 Encounter for screening mammogram for malignant neoplasm of breast: Secondary | ICD-10-CM | POA: Diagnosis not present

## 2021-08-04 NOTE — Progress Notes (Signed)
Subjective:        Terri Duran is a 45 y.o. female here for a routine exam.  Current complaints: Irregular periods and vaginal discharge.    Personal health questionnaire:  Is patient Terri Duran, have a family history of breast and/or ovarian cancer: no Is there a family history of uterine cancer diagnosed at age < 43, gastrointestinal cancer, urinary tract cancer, family member who is a Field seismologist syndrome-associated carrier: no Is the patient overweight and hypertensive, family history of diabetes, personal history of gestational diabetes, preeclampsia or PCOS: no Is patient over 77, have PCOS,  family history of premature CHD under age 27, diabetes, smoke, have hypertension or peripheral artery disease:  no At any time, has a partner hit, kicked or otherwise hurt or frightened you?: no Over the past 2 weeks, have you felt down, depressed or hopeless?: no Over the past 2 weeks, have you felt little interest or pleasure in doing things?:no   Gynecologic History No LMP recorded. Contraception: tubal ligation Last Pap: 2020. Results were: normal Last mammogram: 2020. Results were: normal  Obstetric History OB History  Gravida Para Term Preterm AB Living  3 2 2   1 2   SAB IAB Ectopic Multiple Live Births  1       2    # Outcome Date GA Lbr Len/2nd Weight Sex Delivery Anes PTL Lv  3 Term 08/14/02 [redacted]w[redacted]d  6 lb 5 oz (2.863 kg) F CS-LTranv EPI  LIV  2 Term 09/07/00 [redacted]w[redacted]d  6 lb 7 oz (2.92 kg) M CS-LTranv EPI  LIV  1 SAB 1998        DEC    Past Medical History:  Diagnosis Date   BV (bacterial vaginosis)    GERD (gastroesophageal reflux disease)     Past Surgical History:  Procedure Laterality Date   DILATION AND CURETTAGE OF UTERUS     TUBAL LIGATION      No current outpatient medications on file. No Known Allergies  Social History   Tobacco Use   Smoking status: Every Day    Packs/day: 0.50    Types: Cigarettes    Passive exposure: Never   Smokeless tobacco:  Never  Substance Use Topics   Alcohol use: No    Alcohol/week: 0.0 standard drinks    Comment: occassional    Family History  Problem Relation Age of Onset   Diabetes Mother    Hypertension Mother    Hyperlipidemia Mother       Review of Systems  Constitutional: negative for fatigue and weight loss Respiratory: negative for cough and wheezing Cardiovascular: negative for chest pain, fatigue and palpitations Gastrointestinal: negative for abdominal pain and change in bowel habits Musculoskeletal:negative for myalgias Neurological: negative for gait problems and tremors Behavioral/Psych: negative for abusive relationship, depression Endocrine: negative for temperature intolerance    Genitourinary:positive for abnormal menstrual periods and vaginal discharge.  , genital lesions, hot flashes, sexual problems  Integument/breast: negative for breast lump, breast tenderness, nipple discharge and skin lesion(s)    Objective:       BP 120/80 (BP Location: Right Arm, Patient Position: Sitting, Cuff Size: Normal)   Pulse 90   Ht 5\' 5"  (1.651 m)   BMI 46.31 kg/m  General:   Alert and no distress  Skin:   no rash or abnormalities  Lungs:   clear to auscultation bilaterally  Heart:   regular rate and rhythm, S1, S2 normal, no murmur, click, rub or gallop  Breasts:  normal without suspicious masses, skin or nipple changes or axillary nodes  Abdomen:  normal findings: no organomegaly, soft, non-tender and no hernia  Pelvis:  External genitalia: normal general appearance Urinary system: urethral meatus normal and bladder without fullness, nontender Vaginal: normal without tenderness, induration or masses Cervix: normal appearance Adnexa: normal bimanual exam Uterus: anteverted and non-tender, normal size   Lab Review Urine pregnancy test Labs reviewed yes Radiologic studies reviewed yes  I have spent a total of 20 minutes of face-to-face time, excluding clinical staff time,  reviewing notes and preparing to see patient, ordering tests and/or medications, and counseling the patient.   Assessment:    1. Encounter for routine gynecological examination with Papanicolaou smear of cervix Rx: - Cytology - PAP( [place]) - Hemoglobin A1c - CBC - Comprehensive metabolic panel - Lipid Panel With LDL/HDL Ratio - Thyroid Panel With TSH  2. Irregular periods - will follow clinically.  3. Vaginal discharge Rx: - Cervicovaginal ancillary only( [place])  4. Screening breast examination Rx: - MM Digital Screening; Future  5. Class 3 severe obesity due to excess calories without serious comorbidity with body mass index (BMI) of 45.0 to 49.9 in adult [facility])     Plan:    Education reviewed: calcium supplements, depression evaluation, low fat, low cholesterol diet, safe sex/STD prevention, self breast exams, smoking cessation, and weight bearing exercise. Mammogram ordered. Follow up in: 1 year.     Orders Placed This Encounter  Procedures   MM Digital Screening    Standing Status:   Future    Standing Expiration Date:   08/04/2022    Order Specific Question:   Reason for Exam (SYMPTOM  OR DIAGNOSIS REQUIRED)    Answer:   sCREENING    Order Specific Question:   Is the patient pregnant?    Answer:   No    Order Specific Question:   Preferred imaging location?    Answer:   [facility]   Hemoglobin A1c   CBC   Comprehensive metabolic panel   Lipid Panel With LDL/HDL Ratio   Thyroid Panel With TSH     [name], MD 08/04/2021 10:21 AM

## 2021-08-05 ENCOUNTER — Other Ambulatory Visit: Payer: Self-pay | Admitting: Obstetrics

## 2021-08-05 ENCOUNTER — Encounter: Payer: Self-pay | Admitting: Internal Medicine

## 2021-08-05 DIAGNOSIS — D508 Other iron deficiency anemias: Secondary | ICD-10-CM

## 2021-08-05 LAB — LIPID PANEL WITH LDL/HDL RATIO
Cholesterol, Total: 174 mg/dL (ref 100–199)
HDL: 72 mg/dL (ref >39)
LDL Chol Calc (NIH): 91 mg/dL (ref 0–99)
LDL/HDL Ratio: 1.3 ratio (ref 0.0–3.2)
Triglycerides: 54 mg/dL (ref 0–149)
VLDL Cholesterol Cal: 11 mg/dL (ref 5–40)

## 2021-08-05 LAB — CERVICOVAGINAL ANCILLARY ONLY
Bacterial Vaginitis (gardnerella): NEGATIVE
Candida Glabrata: NEGATIVE
Candida Vaginitis: NEGATIVE
Chlamydia: NEGATIVE
Comment: NEGATIVE
Comment: NEGATIVE
Comment: NEGATIVE
Comment: NEGATIVE
Comment: NORMAL
Neisseria Gonorrhea: NEGATIVE

## 2021-08-05 LAB — THYROID PANEL WITH TSH
Free Thyroxine Index: 1.7 (ref 1.2–4.9)
T3 Uptake Ratio: 25 % (ref 24–39)
T4, Total: 6.8 ug/dL (ref 4.5–12.0)
TSH: 0.892 u[IU]/mL (ref 0.450–4.500)

## 2021-08-05 LAB — COMPREHENSIVE METABOLIC PANEL
ALT: 20 IU/L (ref 0–32)
AST: 24 IU/L (ref 0–40)
Albumin/Globulin Ratio: 1.3 (ref 1.2–2.2)
Albumin: 3.9 g/dL (ref 3.8–4.8)
Alkaline Phosphatase: 56 IU/L (ref 44–121)
BUN/Creatinine Ratio: 13 (ref 9–23)
BUN: 10 mg/dL (ref 6–24)
Bilirubin Total: <0.2 mg/dL (ref 0.0–1.2)
CO2: 19 mmol/L — ABNORMAL LOW (ref 20–29)
Calcium: 9.2 mg/dL (ref 8.7–10.2)
Chloride: 108 mmol/L — ABNORMAL HIGH (ref 96–106)
Creatinine, Ser: 0.78 mg/dL (ref 0.57–1.00)
Globulin, Total: 2.9 g/dL (ref 1.5–4.5)
Glucose: 82 mg/dL (ref 70–99)
Potassium: 4.3 mmol/L (ref 3.5–5.2)
Sodium: 141 mmol/L (ref 134–144)
Total Protein: 6.8 g/dL (ref 6.0–8.5)
eGFR: 96 mL/min/{1.73_m2} (ref >59)

## 2021-08-05 LAB — CBC
Hematocrit: 32.8 % — ABNORMAL LOW (ref 34.0–46.6)
Hemoglobin: 10.1 g/dL — ABNORMAL LOW (ref 11.1–15.9)
MCH: 22.2 pg — ABNORMAL LOW (ref 26.6–33.0)
MCHC: 30.8 g/dL — ABNORMAL LOW (ref 31.5–35.7)
MCV: 72 fL — ABNORMAL LOW (ref 79–97)
Platelets: 301 10*3/uL (ref 150–450)
RBC: 4.54 x10E6/uL (ref 3.77–5.28)
RDW: 19.6 % — ABNORMAL HIGH (ref 11.7–15.4)
WBC: 6 10*3/uL (ref 3.4–10.8)

## 2021-08-05 LAB — HEMOGLOBIN A1C
Est. average glucose Bld gHb Est-mCnc: 120 mg/dL
Hgb A1c MFr Bld: 5.8 % — ABNORMAL HIGH (ref 4.8–5.6)

## 2021-08-05 MED ORDER — IRON POLYSACCH CMPLX-B12-FA 150-0.025-1 MG PO CAPS: 1.0000 | ORAL_CAPSULE | ORAL | 5 refills | Status: DC

## 2021-08-06 LAB — CYTOLOGY - PAP
Comment: NEGATIVE
Diagnosis: NEGATIVE
High risk HPV: NEGATIVE

## 2021-08-15 IMAGING — CR DG LUMBAR SPINE COMPLETE 4+V
5 series · 5 of 5 positions shown · non-contrast
Comparison: None.

CLINICAL DATA: Chronic lower back and left leg pain.

EXAM:
LUMBAR SPINE - COMPLETE 4+ VIEW

[l-spine ap]
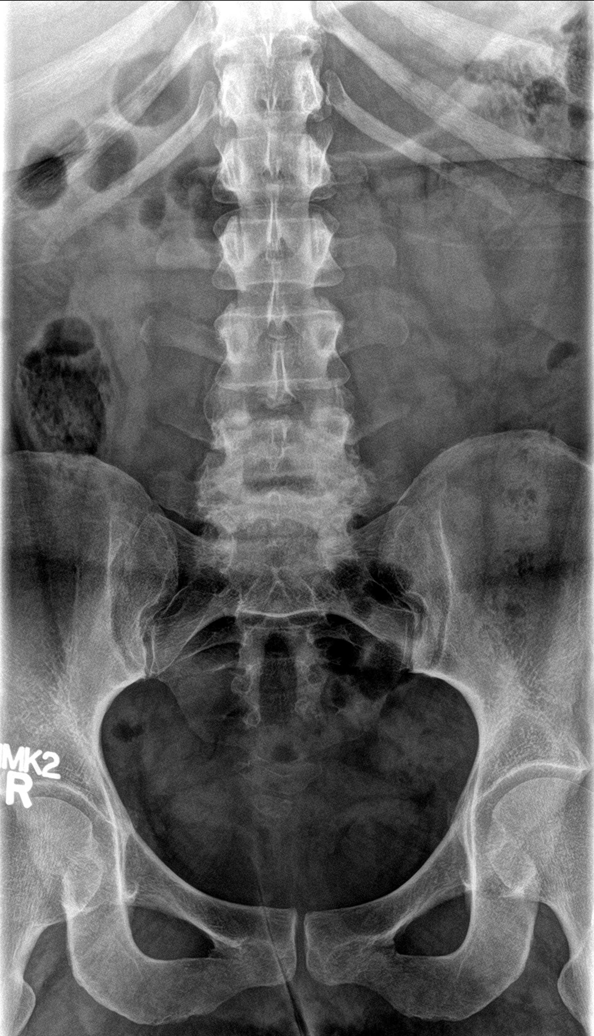

[l-spine obl (1 of 2)]
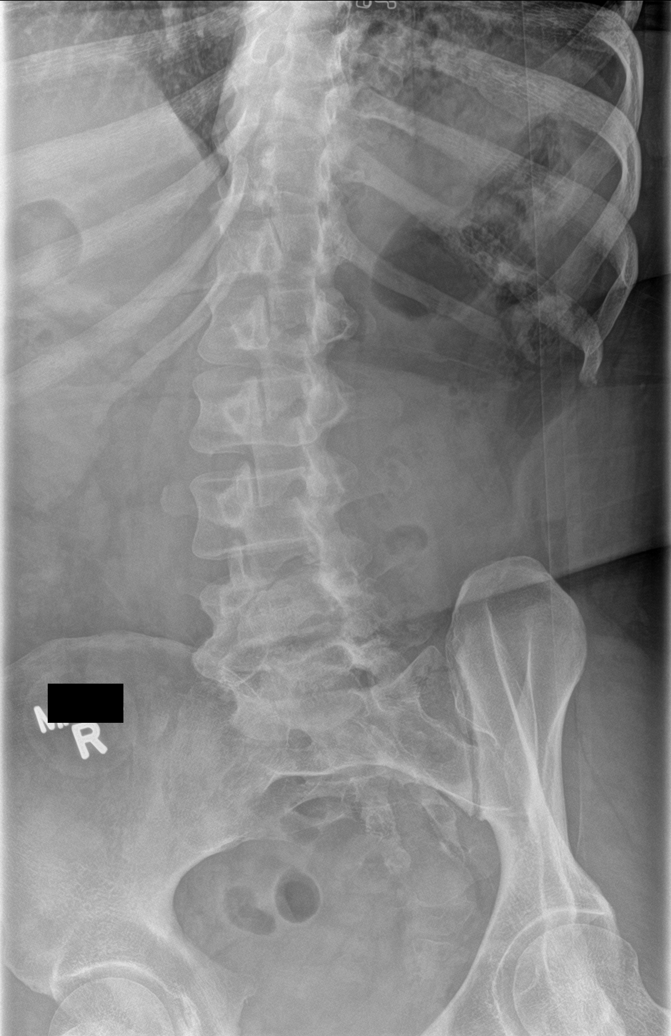

[l-spine obl (2 of 2)]
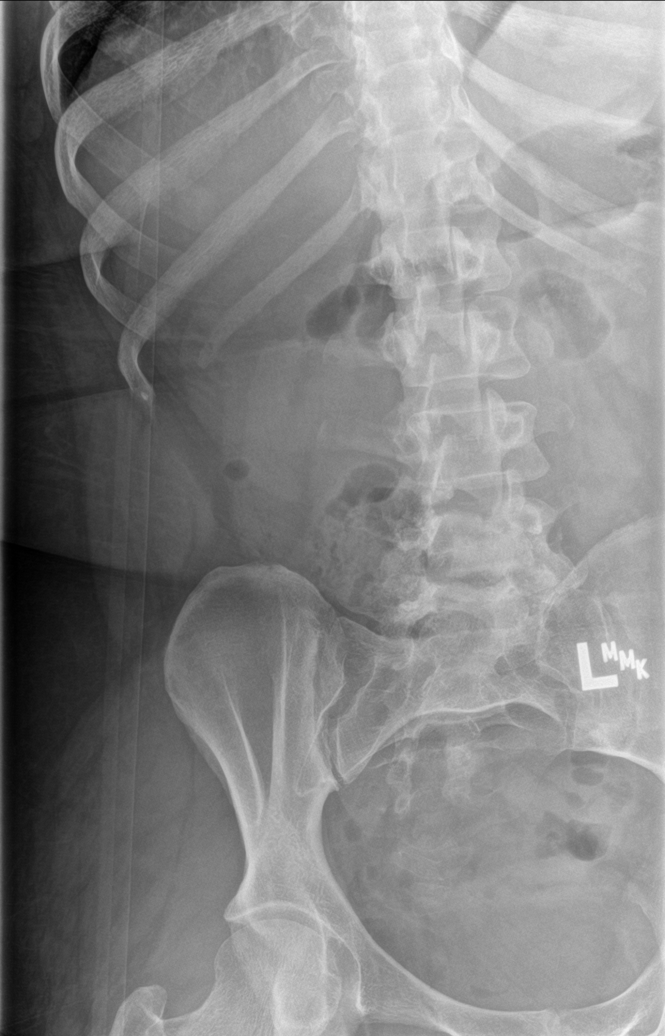

[l-spine lat]
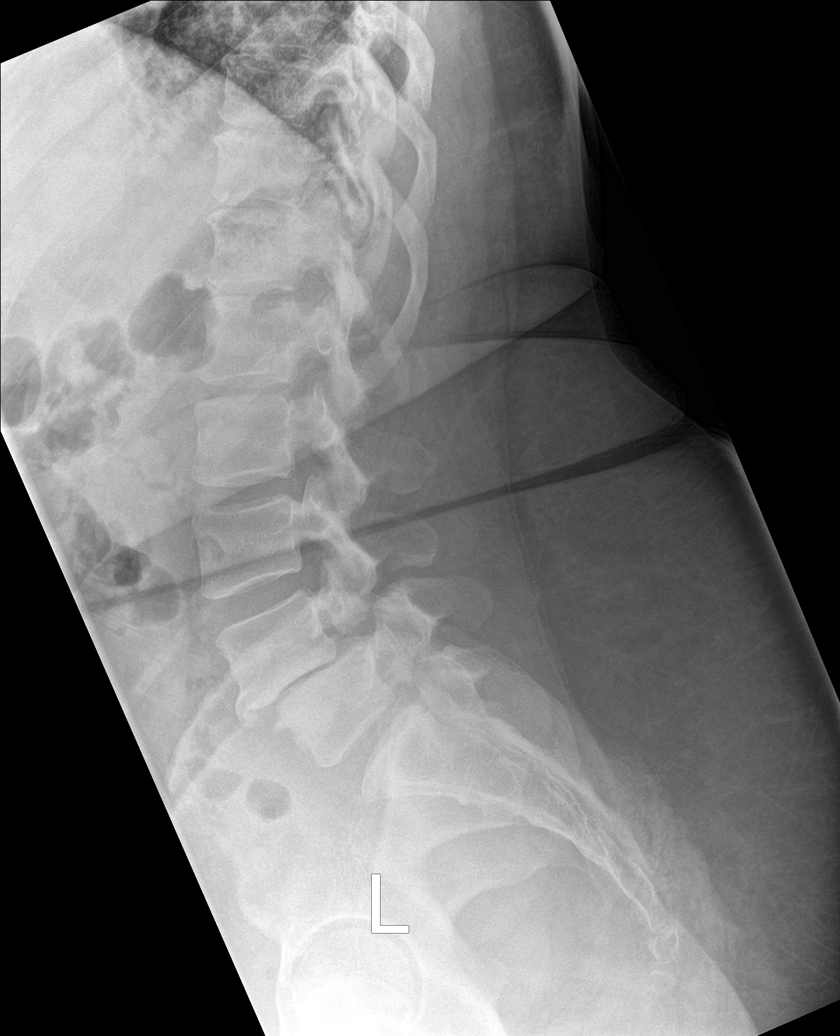

[l-spine spot]
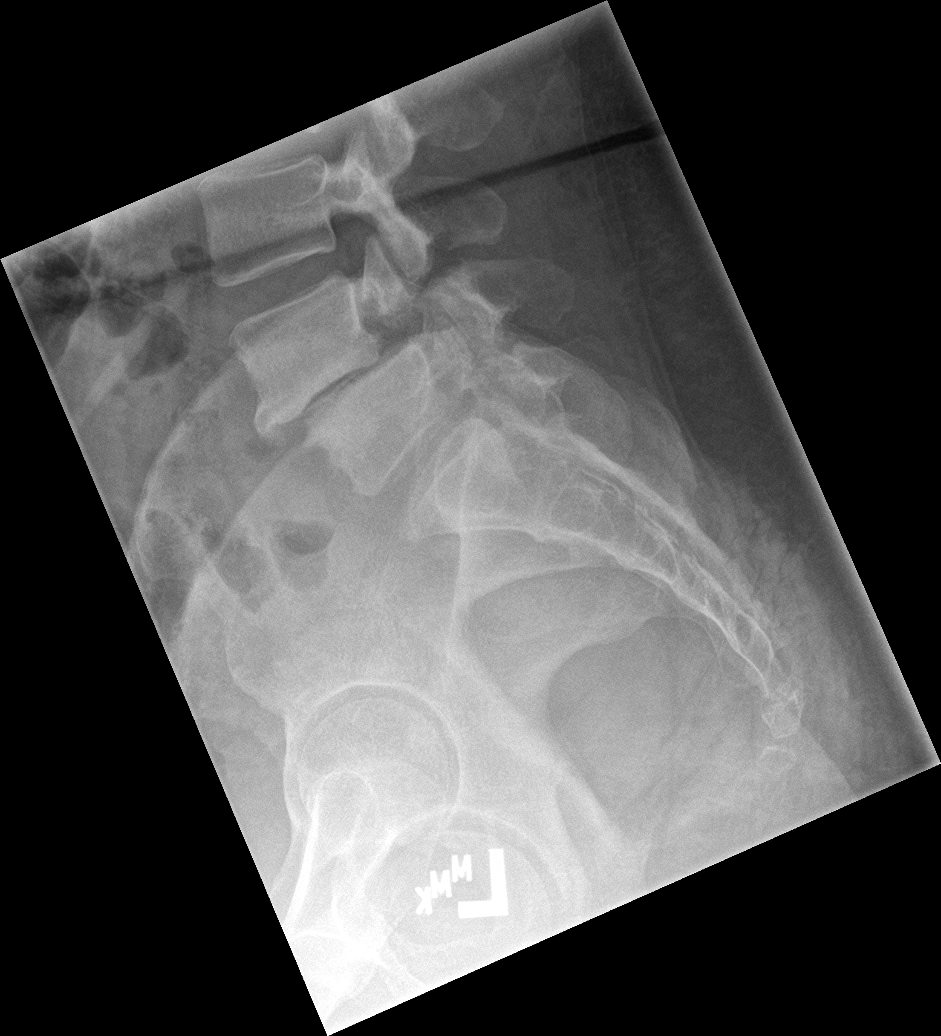

[5 of 5 positions shown; findings below may reference images not displayed]

FINDINGS: Grade 1 anterolisthesis of L4-5 is noted secondary to bilateral L4
spondylolysis. Severe degenerative disc disease is noted at L4-5
with anterior osteophyte formation. Remaining disc spaces are
unremarkable. No definite acute fracture is noted.
IMPRESSION: Grade 1 anterolisthesis of L4-5 is noted secondary to bilateral L4
spondylolysis. Severe degenerative disc disease is noted at L4-5.

## 2021-08-30 DIAGNOSIS — Z419 Encounter for procedure for purposes other than remedying health state, unspecified: Secondary | ICD-10-CM | POA: Diagnosis not present

## 2021-09-30 DIAGNOSIS — Z419 Encounter for procedure for purposes other than remedying health state, unspecified: Secondary | ICD-10-CM | POA: Diagnosis not present

## 2021-10-28 DIAGNOSIS — Z419 Encounter for procedure for purposes other than remedying health state, unspecified: Secondary | ICD-10-CM | POA: Diagnosis not present

## 2021-11-02 DIAGNOSIS — Z20822 Contact with and (suspected) exposure to covid-19: Secondary | ICD-10-CM | POA: Diagnosis not present

## 2021-11-03 DIAGNOSIS — Z20822 Contact with and (suspected) exposure to covid-19: Secondary | ICD-10-CM | POA: Diagnosis not present

## 2021-11-04 DIAGNOSIS — Z20822 Contact with and (suspected) exposure to covid-19: Secondary | ICD-10-CM | POA: Diagnosis not present

## 2021-11-05 DIAGNOSIS — Z20822 Contact with and (suspected) exposure to covid-19: Secondary | ICD-10-CM | POA: Diagnosis not present

## 2021-11-28 DIAGNOSIS — Z419 Encounter for procedure for purposes other than remedying health state, unspecified: Secondary | ICD-10-CM | POA: Diagnosis not present

## 2021-12-28 DIAGNOSIS — Z419 Encounter for procedure for purposes other than remedying health state, unspecified: Secondary | ICD-10-CM | POA: Diagnosis not present

## 2022-01-28 DIAGNOSIS — Z419 Encounter for procedure for purposes other than remedying health state, unspecified: Secondary | ICD-10-CM | POA: Diagnosis not present

## 2022-02-04 ENCOUNTER — Telehealth: Payer: Medicaid Other | Admitting: Physician Assistant

## 2022-02-04 ENCOUNTER — Other Ambulatory Visit (HOSPITAL_COMMUNITY): Payer: Self-pay

## 2022-02-04 ENCOUNTER — Ambulatory Visit: Payer: Self-pay

## 2022-02-04 DIAGNOSIS — Z5321 Procedure and treatment not carried out due to patient leaving prior to being seen by health care provider: Secondary | ICD-10-CM

## 2022-02-04 DIAGNOSIS — J019 Acute sinusitis, unspecified: Secondary | ICD-10-CM | POA: Diagnosis not present

## 2022-02-04 DIAGNOSIS — B9689 Other specified bacterial agents as the cause of diseases classified elsewhere: Secondary | ICD-10-CM | POA: Diagnosis not present

## 2022-02-04 MED ORDER — DOXYCYCLINE HYCLATE 100 MG PO TABS
100.0000 mg | ORAL_TABLET | Freq: Two times a day (BID) | ORAL | 0 refills | Status: DC
  Filled 2022-02-04 – 2023-01-27 (×3): qty 20, 10d supply, fill #0

## 2022-02-04 MED ORDER — FLUTICASONE PROPIONATE 50 MCG/ACT NA SUSP
2.0000 | Freq: Every day | NASAL | 0 refills | Status: DC
  Filled 2022-02-04 – 2022-11-26 (×4): qty 16, 30d supply, fill #0

## 2022-02-04 NOTE — Telephone Encounter (Signed)
Reason for Disposition . [1] Redness or swelling on the cheek, forehead or around the eye AND [2] no fever  Answer Assessment - Initial Assessment Questions 1. LOCATION: "Where does it hurt?"      Sinus pain- eyes 2. ONSET: "When did the sinus pain start?"  (e.g., hours, days)      Tuesday night 3. SEVERITY: "How bad is the pain?"   (Scale 1-10; mild, moderate or severe)   - MILD (1-3): doesn't interfere with normal activities    - MODERATE (4-7): interferes with normal activities (e.g., work or school) or awakens from sleep   - SEVERE (8-10): excruciating pain and patient unable to do any normal activities        Moderate- out of work 4. RECURRENT SYMPTOM: "Have you ever had sinus problems before?" If Yes, ask: "When was the last time?" and "What happened that time?"      Yes- last year- no treatment 5. NASAL CONGESTION: "Is the nose blocked?" If Yes, ask: "Can you open it or must you breathe through your mouth?"     Breathing mouth 6. NASAL DISCHARGE: "Do you have discharge from your nose?" If so ask, "What color?"     yellow 7. FEVER: "Do you have a fever?" If Yes, ask: "What is it, how was it measured, and when did it start?"      no 8. OTHER SYMPTOMS: "Do you have any other symptoms?" (e.g., sore throat, cough, earache, difficulty breathing)     no 9. PREGNANCY: "Is there any chance you are pregnant?" "When was your last menstrual period?"  Protocols used: Sinus Pain or Congestion-A-AH

## 2022-02-04 NOTE — Patient Instructions (Signed)
Brand Males, thank you for joining Leeanne Rio, PA-C for today's virtual visit.  While this provider is not your primary care provider (PCP), if your PCP is located in our provider database this encounter information will be shared with them immediately following your visit.  Consent: (Patient) Brand Males provided verbal consent for this virtual visit at the beginning of the encounter.  Current Medications:  Current Outpatient Medications:    Iron Polysacch Cmplx-B12-FA 150-0.025-1 MG CAPS, Take 1 capsule by mouth every other day., Disp: 30 capsule, Rfl: 5   Medications ordered in this encounter:  No orders of the defined types were placed in this encounter.    *If you need refills on other medications prior to your next appointment, please contact your pharmacy*  Follow-Up: Call back or seek an in-person evaluation if the symptoms worsen or if the condition fails to improve as anticipated.  Other Instructions Please take antibiotic as directed.  Increase fluid intake.  Use Saline nasal spray.  Take a daily multivitamin. Use the Flonase as directed.  Place a humidifier in the bedroom.  Please call or return clinic if symptoms are not improving.  Sinusitis Sinusitis is redness, soreness, and swelling (inflammation) of the paranasal sinuses. Paranasal sinuses are air pockets within the bones of your face (beneath the eyes, the middle of the forehead, or above the eyes). In healthy paranasal sinuses, mucus is able to drain out, and air is able to circulate through them by way of your nose. However, when your paranasal sinuses are inflamed, mucus and air can become trapped. This can allow bacteria and other germs to grow and cause infection. Sinusitis can develop quickly and last only a short time (acute) or continue over a long period (chronic). Sinusitis that lasts for more than 12 weeks is considered chronic.  CAUSES  Causes of sinusitis include: Allergies. Structural  abnormalities, such as displacement of the cartilage that separates your nostrils (deviated septum), which can decrease the air flow through your nose and sinuses and affect sinus drainage. Functional abnormalities, such as when the small hairs (cilia) that line your sinuses and help remove mucus do not work properly or are not present. SYMPTOMS  Symptoms of acute and chronic sinusitis are the same. The primary symptoms are pain and pressure around the affected sinuses. Other symptoms include: Upper toothache. Earache. Headache. Bad breath. Decreased sense of smell and taste. A cough, which worsens when you are lying flat. Fatigue. Fever. Thick drainage from your nose, which often is green and may contain pus (purulent). Swelling and warmth over the affected sinuses. DIAGNOSIS  Your caregiver will perform a physical exam. During the exam, your caregiver may: Look in your nose for signs of abnormal growths in your nostrils (nasal polyps). Tap over the affected sinus to check for signs of infection. View the inside of your sinuses (endoscopy) with a special imaging device with a light attached (endoscope), which is inserted into your sinuses. If your caregiver suspects that you have chronic sinusitis, one or more of the following tests may be recommended: Allergy tests. Nasal culture A sample of mucus is taken from your nose and sent to a lab and screened for bacteria. Nasal cytology A sample of mucus is taken from your nose and examined by your caregiver to determine if your sinusitis is related to an allergy. TREATMENT  Most cases of acute sinusitis are related to a viral infection and will resolve on their own within 10 days. Sometimes medicines are  prescribed to help relieve symptoms (pain medicine, decongestants, nasal steroid sprays, or saline sprays).  However, for sinusitis related to a bacterial infection, your caregiver will prescribe antibiotic medicines. These are medicines that  will help kill the bacteria causing the infection.  Rarely, sinusitis is caused by a fungal infection. In theses cases, your caregiver will prescribe antifungal medicine. For some cases of chronic sinusitis, surgery is needed. Generally, these are cases in which sinusitis recurs more than 3 times per year, despite other treatments. HOME CARE INSTRUCTIONS  Drink plenty of water. Water helps thin the mucus so your sinuses can drain more easily. Use a humidifier. Inhale steam 3 to 4 times a day (for example, sit in the bathroom with the shower running). Apply a warm, moist washcloth to your face 3 to 4 times a day, or as directed by your caregiver. Use saline nasal sprays to help moisten and clean your sinuses. Take over-the-counter or prescription medicines for pain, discomfort, or fever only as directed by your caregiver. SEEK IMMEDIATE MEDICAL CARE IF: You have increasing pain or severe headaches. You have nausea, vomiting, or drowsiness. You have swelling around your face. You have vision problems. You have a stiff neck. You have difficulty breathing. MAKE SURE YOU:  Understand these instructions. Will watch your condition. Will get help right away if you are not doing well or get worse. Document Released: 08/16/2005 Document Revised: 11/08/2011 Document Reviewed: 08/31/2011 [facility] Patient Information 2014 [place], [place].    If you have been instructed to have an in-person evaluation today at a local Urgent Care facility, please use the link below. It will take you to a list of all of our available [facility], including address, phone number and hours of operation. Please do not delay care.  [facility]  If you or a family member do not have a primary care provider, use the link below to schedule a visit and establish care. When you choose a [facility] primary care physician or advanced practice provider, you gain a long-term partner in health. Find a  Primary Care Provider  Learn more about [facility] in-office and virtual care options: [facility]

## 2022-02-04 NOTE — Progress Notes (Signed)
Virtual Visit Consent   Terri Duran, you are scheduled for a virtual visit with a [facility] provider today. Just as with appointments in the office, your consent must be obtained to participate. Your consent will be active for this visit and any virtual visit you may have with one of our providers in the next 365 days. If you have a MyChart account, a copy of this consent can be sent to you electronically.  As this is a virtual visit, video technology does not allow for your provider to perform a traditional examination. This may limit your provider's ability to fully assess your condition. If your provider identifies any concerns that need to be evaluated in person or the need to arrange testing (such as labs, EKG, etc.), we will make arrangements to do so. Although advances in technology are sophisticated, we cannot ensure that it will always work on either your end or our end. If the connection with a video visit is poor, the visit may have to be switched to a telephone visit. With either a video or telephone visit, we are not always able to ensure that we have a secure connection.  By engaging in this virtual visit, you consent to the provision of healthcare and authorize for your insurance to be billed (if applicable) for the services provided during this visit. Depending on your insurance coverage, you may receive a charge related to this service.  I need to obtain your verbal consent now. Are you willing to proceed with your visit today? Terri Duran has provided verbal consent on 02/04/2022 for a virtual visit (video or telephone). Leeanne Rio, [place]  Date: 02/04/2022 3:27 PM  Virtual Visit via Video Note   I, Leeanne Rio, connected with  Terri Duran  ([id number], 09-May-1976) on 02/04/22 at  3:15 PM EDT by a video-enabled telemedicine application and verified that I am speaking with the correct person using two identifiers.  Location: Patient: Virtual Visit Location  Patient: Home Provider: Virtual Visit Location Provider: Home Office   I discussed the limitations of evaluation and management by telemedicine and the availability of in person appointments. The patient expressed understanding and agreed to proceed.    History of Present Illness: Terri Duran is a 46 y.o. who identifies as a female who was assigned female at birth, and is being seen today for possible sinusitis. Endorses that her symptoms starting over the past several days with nasal and head congestion, now with sinus pain. Denies fever, chills, aches. Denies recent travel or sick contact. Has taken OTC Aleve for the sinus pain. Took home COVID test which was negative.    HPI: HPI  Problems:  Patient Active Problem List   Diagnosis Date Noted   Neuropathy of left lower extremity 03/24/2020   Pain in right leg 03/24/2020   Chronic bilateral low back pain without sciatica 03/24/2020   Morbid obesity ([place]) 03/05/2015   Family history of diabetes mellitus (DM) 11/21/2013   High BMI 11/21/2013   Smoker 11/21/2013    Allergies: No Known Allergies Medications:  Current Outpatient Medications:    doxycycline (VIBRA-TABS) 100 MG tablet, Take 1 tablet (100 mg total) by mouth 2 (two) times daily., Disp: 20 tablet, Rfl: 0   fluticasone (FLONASE) 50 MCG/ACT nasal spray, Place 2 sprays into both nostrils daily., Disp: 16 g, Rfl: 0   Iron Polysacch Cmplx-B12-FA 150-0.025-1 MG CAPS, Take 1 capsule by mouth every other day., Disp: 30 capsule, Rfl: 5  Observations/Objective:  Patient is well-developed, well-nourished in no acute distress.  Resting comfortably at home.  Head is normocephalic, atraumatic.  No labored breathing. Speech is clear and coherent with logical content.  Patient is alert and oriented at baseline.   Assessment and Plan: 1. Acute bacterial sinusitis - doxycycline (VIBRA-TABS) 100 MG tablet; Take 1 tablet (100 mg total) by mouth 2 (two) times daily.  Dispense: 20 tablet;  Refill: 0 - fluticasone (FLONASE) 50 MCG/ACT nasal spray; Place 2 sprays into both nostrils daily.  Dispense: 16 g; Refill: 0  Rx Doxycycline.  Increase fluids.  Rest.  Saline nasal spray.  Probiotic.  Mucinex as directed.  Humidifier in bedroom. Flonase per orders.  Call or return to clinic if symptoms are not improving.   Follow Up Instructions: I discussed the assessment and treatment plan with the patient. The patient was provided an opportunity to ask questions and all were answered. The patient agreed with the plan and demonstrated an understanding of the instructions.  A copy of instructions were sent to the patient via MyChart unless otherwise noted below.   The patient was advised to call back or seek an in-person evaluation if the symptoms worsen or if the condition fails to improve as anticipated.  Time:  I spent 10 minutes with the patient via telehealth technology discussing the above problems/concerns.    Leeanne Rio, PA-C

## 2022-02-04 NOTE — Telephone Encounter (Signed)
Summary: Swollen eyes   Swollen eyes due to possible sinus infection   Best contact: [phone]     Attempted to call pt on mobile number twice. A recording stated call cannot be completed at this time.

## 2022-02-04 NOTE — Progress Notes (Signed)
Patient connected to video 10 mintues after appointment. Tried to start visit with her but her connection was bad so could not communicate. Sent chat message through the [facility] video portal with no response. Called both numbers listed x 2 and got a message for mobile that "number no longer in service" and for home that "this number is restricted" so unable to connect with patient. Will keep an eye out for her to reschedule or rejoin video later time.

## 2022-02-12 ENCOUNTER — Other Ambulatory Visit (HOSPITAL_COMMUNITY): Payer: Self-pay

## 2022-02-27 DIAGNOSIS — Z419 Encounter for procedure for purposes other than remedying health state, unspecified: Secondary | ICD-10-CM | POA: Diagnosis not present

## 2022-03-24 ENCOUNTER — Other Ambulatory Visit: Payer: Self-pay | Admitting: Physician Assistant

## 2022-03-24 ENCOUNTER — Ambulatory Visit: Payer: Self-pay

## 2022-03-24 ENCOUNTER — Encounter: Payer: Self-pay | Admitting: Physician Assistant

## 2022-03-24 DIAGNOSIS — R4589 Other symptoms and signs involving emotional state: Secondary | ICD-10-CM

## 2022-03-24 MED ORDER — HYDROXYZINE HCL 25 MG PO TABS: ORAL_TABLET | ORAL | 0 refills | Status: DC

## 2022-03-24 NOTE — Telephone Encounter (Unsigned)
Message route to [employer], PA-C.  Medication will be sent

## 2022-03-24 NOTE — Progress Notes (Signed)
Hydroxyzine prescribed to help patient dealing with grief over the loss of children.  Kennieth Rad, PA-C Physician Assistant [facility] [email or web address]

## 2022-03-24 NOTE — Telephone Encounter (Signed)
     Chief Complaint: Pt. Son died last week and her daughter died yesterday. Having anxiety, tearful. Asking for VV today or medication be sent to her pharmacy. Symptoms: Above Frequency: Last week Pertinent Negatives: Patient denies thoughts of self harm Disposition: '[]'$ ED /'[]'$ Urgent Care (no appt availability in office) / '[]'$ Appointment(In office/virtual)/ '[]'$  [facility]/ '[]'$ Home Care/ '[]'$ Refused Recommended Disposition /'[]'$ [facility]/ '[x]'$  Follow-up with PCP Additional Notes: Please advise pt.  Answer Assessment - Initial Assessment Questions 1. CONCERN: "Did anything happen that prompted you to call today?"      Anxiety 2. ANXIETY SYMPTOMS: "Can you describe how you (your loved one; patient) have been feeling?" (e.g., tense, restless, panicky, anxious, keyed up, overwhelmed, sense of impending doom).      Anxiety 3. ONSET: "How long have you been feeling this way?" (e.g., hours, days, weeks)     Last week 4. SEVERITY: "How would you rate the level of anxiety?" (e.g., 0 - 10; or mild, moderate, severe).     Severe 5. FUNCTIONAL IMPAIRMENT: "How have these feelings affected your ability to do daily activities?" "Have you had more difficulty than usual doing your normal daily activities?" (e.g., getting better, same, worse; self-care, school, work, interactions)     Worse 6. HISTORY: "Have you felt this way before?" "Have you ever been diagnosed with an anxiety problem in the past?" (e.g., generalized anxiety disorder, panic attacks, PTSD). If Yes, ask: "How was this problem treated?" (e.g., medicines, counseling, etc.)     No 7. RISK OF HARM - SUICIDAL IDEATION: "Do you ever have thoughts of hurting or killing yourself?" If Yes, ask:  "Do you have these feelings now?" "Do you have a plan on how you would do this?"     No 8. TREATMENT:  "What has been done so far to treat this anxiety?" (e.g., medicines, relaxation strategies). "What has helped?"     None 9. TREATMENT -  THERAPIST: "Do you have a counselor or therapist? Name?"     No 10. POTENTIAL TRIGGERS: "Do you drink caffeinated beverages (e.g., coffee, colas, teas), and how much daily?" "Do you drink alcohol or use any drugs?" "Have you started any new medicines recently?"       No 11. PATIENT SUPPORT: "Who is with you now?" "Who do you live with?" "Do you have family or friends who you can talk to?"        Family 12. OTHER SYMPTOMS: "Do you have any other symptoms?" (e.g., feeling depressed, trouble concentrating, trouble sleeping, trouble breathing, palpitations or fast heartbeat, chest pain, sweating, nausea, or diarrhea)       Depressed 13. PREGNANCY: "Is there any chance you are pregnant?" "When was your last menstrual period?"       No  Protocols used: Anxiety and Panic Attack-A-AH

## 2022-03-30 DIAGNOSIS — Z419 Encounter for procedure for purposes other than remedying health state, unspecified: Secondary | ICD-10-CM | POA: Diagnosis not present

## 2022-04-23 ENCOUNTER — Ambulatory Visit: Payer: Self-pay

## 2022-04-23 ENCOUNTER — Inpatient Hospital Stay: Admission: RE | Admit: 2022-04-23 | Payer: Self-pay | Source: Ambulatory Visit
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
## 2022-04-30 DIAGNOSIS — Z419 Encounter for procedure for purposes other than remedying health state, unspecified: Secondary | ICD-10-CM | POA: Diagnosis not present

## 2022-05-06 ENCOUNTER — Ambulatory Visit: Payer: Medicaid Other | Attending: Internal Medicine | Admitting: Internal Medicine

## 2022-05-06 ENCOUNTER — Encounter: Payer: Self-pay | Admitting: Internal Medicine

## 2022-05-06 VITALS — BP 128/84 | HR 71 | Temp 98.1°F | Ht 65.0 in | Wt 290.2 lb

## 2022-05-06 DIAGNOSIS — F321 Major depressive disorder, single episode, moderate: Secondary | ICD-10-CM | POA: Diagnosis not present

## 2022-05-06 DIAGNOSIS — F411 Generalized anxiety disorder: Secondary | ICD-10-CM

## 2022-05-06 DIAGNOSIS — F5102 Adjustment insomnia: Secondary | ICD-10-CM | POA: Diagnosis not present

## 2022-05-06 DIAGNOSIS — Z2821 Immunization not carried out because of patient refusal: Secondary | ICD-10-CM | POA: Diagnosis not present

## 2022-05-06 DIAGNOSIS — M5431 Sciatica, right side: Secondary | ICD-10-CM | POA: Diagnosis not present

## 2022-05-06 MED ORDER — TRAZODONE HCL 50 MG PO TABS: 25.0000 mg | ORAL_TABLET | Freq: Every day | ORAL | 1 refills | Status: DC

## 2022-05-06 MED ORDER — PREDNISONE 20 MG PO TABS: ORAL_TABLET | ORAL | 0 refills | Status: DC

## 2022-05-06 NOTE — Progress Notes (Signed)
Pt requesting new Anxiety & Depression Rx  Pt states that Hydroxyzine isn't working
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
## 2022-05-06 NOTE — Patient Instructions (Signed)
[facility] [address], [place], [place] [address] [phone] or [phone] Walk-in urgent care 24/7 for anyone  For [facility] ONLY New patient assessments and therapy walk-ins: Monday and Wednesday 8am-11am First and second Friday 1pm-5pm New patient psychiatry and medication management walk-ins: Mondays, Wednesdays, Thursdays, Fridays 8am-11am No psychiatry walk-ins on Tuesday   *Accepts all insurance and uninsured for Urgent Care needs *Accepts Medicaid and uninsured for outpatient treatment   [facility] (Therapy and psychiatry) [facility] (near [place]) [address] [place], [place] [address] [phone] Fax: [phone] ([place])   [facility] [place],  [place]  [address] [phone] Call for appointment  [facility] the [place] (Therapy only)  [facility] [address], [place], [place] [address] Monday - Friday: 8:30 a.m.-12 p.m. / 1 p.m.-2:30 p.m.  [facility] [address] [place], [place] [address] Monday-Friday: 8:30 a.m.-12 p.m. / 2-3:30 p.m. (INSURANCE REQUIRED -MEDICAID ACCEPTED) They do offer a sliding fee scale $20-$30/session   [facility] [place], [place] [address] Phone: [place] [address] [place] [address]  Phone: [phone] (Does not accept Medicaid) (only one provider accepts Medicare) [facility] [address] (at [employer]) [place], [place] [address] (Accepts Medicaid and Medicare)  [facility]  [address]  [place], [place] [address]  Phone: [phone]  [address] [place], [place] [address] Phone: [phone] [facility] Medicaid) [facility] (Therapy only)  [address] [place], [place] [address] Phone: [phone]   [facility] (Therapy only)  [place], [place] [address] Phone: [phone] [facility])   [place] [address] [place], [place] [address] Phone: [phone] ([place]) [employer] [phone] [address] [place], [place] [address] Phone: [phone] [facility]) [facility] (Psychiatry only)  [phone] [address], [place], [place] [address] (Accepts Medicaid and Medicare) [facility] (Psychiatry and therapy)  [place], [place], [place] [address] [phone] [facility] Medicare) [place] (psychiatry and therapy) [address], [place], [place] [address] [phone]  [facility] [address], [place], [place], [place] [address] [phone] Accept [address] [place], [place], [place], [place],  and the following types of Medicaid; [place], [place], Partners, [place], [place], [employer], Healthy [place], [place] Complete, and [place], as well as offering a Manufacturing systems engineer and private payment options. Provides In-Office Appointments, Virtual Appointments, and Phone Consultations Offers medication management for ages 46 years old and up, including,  Medication Management for Suboxone and [place] [phone] [address], [place], [place] [address] ([place] Medicaid and Medicare)         19.  [facility] (therapy only)  [address] [place], [place] [address]            [phone] (Accepts medicare) 20. [facility]  (Suboxone and methodone) [phone] [address], [place], [place] [address] To Be Eligible for Opioid Treatment at [facility] you must be at least 45 years of age you have already tried other  interventions that were not successful such as opioid  detox, inpatient rehab for opioids, or outpatient counseling specifically for opioid dependency your [facility] drug test must be completely free of benzodiazepines (klonopin, xanax, valium, ativan, or other benz) you have reliable transportation to the [facility] clinic in [place] you recognize that counseling is a critical component of [facility]' Opioid Program and you agree to attend all required counseling sessions you are committed to total drug abstinence and will conscientiously strive to remain free of alcohol, marijuana, and other illicit substances while in treatment you desire a peaceful treatment atmosphere in which personal responsibility and respect toward staff and clients is the norm   21. [facility] [address], [place], [place] [address] Offers SAIOP (Substance Abuse Intensive Outpatient Program) [phone] 22. [facility] [address] [place], [place] [address] [phone] (Accepts medicare)  For those who are tech savvy, go on psychology today, type in your local city (i.e. [place]. [place]) and specify your insurance at the top of the screen after you search. (Medicaid if needed). You can also specify whether you are interested in therapy and psychiatry.  [email or web address].com/us      

## 2022-05-30 DIAGNOSIS — Z419 Encounter for procedure for purposes other than remedying health state, unspecified: Secondary | ICD-10-CM | POA: Diagnosis not present

## 2022-06-08 ENCOUNTER — Ambulatory Visit
Admission: RE | Admit: 2022-06-08 | Discharge: 2022-06-08 | Disposition: A | Discharge status: Home / Self Care | Payer: Medicaid Other | Source: Ambulatory Visit | Attending: Physician Assistant | Admitting: Physician Assistant

## 2022-06-08 VITALS — BP 113/75 | HR 76 | Temp 98.4°F | Resp 20

## 2022-06-08 DIAGNOSIS — M5431 Sciatica, right side: Secondary | ICD-10-CM

## 2022-06-08 DIAGNOSIS — M5432 Sciatica, left side: Secondary | ICD-10-CM | POA: Diagnosis not present

## 2022-06-08 MED ORDER — METHYLPREDNISOLONE SODIUM SUCC 125 MG IJ SOLR
125.0000 mg | Freq: Once | INTRAMUSCULAR | Status: AC
  Administered 2022-06-08: 125 mg via INTRAMUSCULAR

## 2022-06-08 NOTE — ED Triage Notes (Signed)
Pt presents to uc with co of sciatic nerves pain on right side that radiates down her leg and is causing numbness and tingling in the front of her leg. Pt reports she also had some knee swelling but that has subsided. Pt was given prednisone last month and st she did have a shot for it once which helped. Pt endorses tylenol and motin have not been helping.

## 2022-06-08 NOTE — ED Provider Notes (Signed)
[facility]    CSN: [id number] Arrival date & time: 06/08/22  1002      History   Chief Complaint Chief Complaint  Patient presents with   Hip Pain   Leg Pain    HPI ARMONII SIEH is a 46 y.o. female.   Patient here today for evaluation of sciatic nerve pain bilaterally that worsened recently.  She describes sciatic pain as a burning sensation that is worse to her right leg than left. She has known issues with lower back pain and history of sciatica. She has had injection in the past that was helpful and is hopeful for same today. She denies any new symptoms. She has tried using tylenol and motrin without significant relief.   The history is provided by the patient.  Hip Pain  Leg Pain Associated symptoms: back pain   Associated symptoms: no fever     Past Medical History:  Diagnosis Date   BV (bacterial vaginosis)    GERD (gastroesophageal reflux disease)     Patient Active Problem List   Diagnosis Date Noted   Neuropathy of left lower extremity 03/24/2020   Pain in right leg 03/24/2020   Chronic bilateral low back pain without sciatica 03/24/2020   Morbid obesity ([place]) 03/05/2015   Family history of diabetes mellitus (DM) 11/21/2013   High BMI 11/21/2013   Smoker 11/21/2013    Past Surgical History:  Procedure Laterality Date   DILATION AND CURETTAGE OF UTERUS     TUBAL LIGATION      OB History     Gravida  3   Para  2   Term  2   Preterm      AB  1   Living  2      SAB  1   IAB      Ectopic      Multiple      Live Births  2            Home Medications    Prior to Admission medications   Medication Sig Start Date End Date Taking? Authorizing Provider  doxycycline (VIBRA-TABS) 100 MG tablet Take 1 tablet (100 mg total) by mouth 2 (two) times daily. Patient not taking: Reported on 05/06/2022 02/04/22   Brunetta Jeans, PA-C  fluticasone [facility]) 50 MCG/ACT nasal spray Place 2 sprays into both nostrils  daily. Patient not taking: Reported on 05/06/2022 02/04/22   Brunetta Jeans, PA-C  hydrOXYzine (ATARAX) 25 MG tablet May use 1-2 tablets PO q6-8 hours PRN for anxiety Patient not taking: Reported on 05/06/2022 03/24/22   Mayers, Loraine Grip, PA-C  Iron Polysacch Cmplx-B12-FA 150-0.025-1 MG CAPS Take 1 capsule by mouth every other day. 08/05/21   [name], MD  predniSONE (DELTASONE) 20 MG tablet 1 tab PO daily x 2 days then 1/2 tab PO daily x 3 days 05/06/22   Ladell Pier, MD  traZODone (DESYREL) 50 MG tablet Take 0.5-1 tablets (25-50 mg total) by mouth at bedtime. 05/06/22   Ladell Pier, MD  hydrochlorothiazide (HYDRODIURIL) 25 MG tablet Take 1 tablet (25 mg total) by mouth daily. 04/12/18 07/24/19  Wardell Honour, MD    Family History Family History  Problem Relation Age of Onset   Diabetes Mother    Hypertension Mother    Hyperlipidemia Mother    Breast cancer Maternal Aunt     Social History Social History   Tobacco Use   Smoking status: Every Day  Packs/day: 0.50    Types: Cigarettes    Passive exposure: Never   Smokeless tobacco: Never  Vaping Use   Vaping Use: Every day   Substances: Nicotine, Flavoring  Substance Use Topics   Alcohol use: No    Alcohol/week: 0.0 standard drinks of alcohol    Comment: occassional   Drug use: No     Allergies   Patient has no known allergies.   Review of Systems Review of Systems  Constitutional:  Negative for chills and fever.  Eyes:  Negative for discharge and redness.  Gastrointestinal:  Negative for nausea and vomiting.  Musculoskeletal:  Positive for back pain and myalgias.     Physical Exam Triage Vital Signs ED Triage Vitals  Enc Vitals Group     BP 06/08/22 1020 113/75     Pulse Rate 06/08/22 1020 76     Resp 06/08/22 1020 20     Temp 06/08/22 1020 98.4 F (36.9 C)     Temp src --      SpO2 06/08/22 1020 93 %     Weight --      Height --      Head Circumference --      Peak Flow --      Pain  Score 06/08/22 1019 5     Pain Loc --      Pain Edu? --      Excl. in [place]? --    No data found.  Updated Vital Signs BP 113/75   Pulse 76   Temp 98.4 F (36.9 C)   Resp 20   LMP 06/01/2022   SpO2 93%      Physical Exam Vitals and nursing note reviewed.  Constitutional:      General: She is not in acute distress.    Appearance: Normal appearance. She is not ill-appearing.  HENT:     Head: Normocephalic and atraumatic.  Eyes:     Conjunctiva/sclera: Conjunctivae normal.  Cardiovascular:     Rate and Rhythm: Normal rate.  Pulmonary:     Effort: Pulmonary effort is normal.  Musculoskeletal:     Comments: No TTP to midline thoracic or lumbar spine, patient reports pain to right low back  Neurological:     Mental Status: She is alert.  Psychiatric:        Mood and Affect: Mood normal.        Behavior: Behavior normal.        Thought Content: Thought content normal.      UC Treatments / Results  Labs (all labs ordered are listed, but only abnormal results are displayed) Labs Reviewed - No data to display  EKG   Radiology No results found.  Procedures Procedures (including critical care time)  Medications Ordered in UC Medications  methylPREDNISolone sodium succinate (SOLU-MEDROL) 125 mg/2 mL injection 125 mg (125 mg Intramuscular Given 06/08/22 1104)    Initial Impression / Assessment and Plan / UC Course  I have reviewed the triage vital signs and the nursing notes.  Pertinent labs & imaging results that were available during my care of the patient were reviewed by me and considered in my medical decision making (see chart for details).    Solumedrol injection administered in office to hopefully help alleviate pain and improve symptoms. Encourage follow up with any further concerns.   Final Clinical Impressions(s) / UC Diagnoses   Final diagnoses:  Bilateral sciatica   Discharge Instructions   None    ED Prescriptions  None    PDMP not  reviewed this encounter.   Francene Finders, PA-C 06/08/22 1211

## 2022-06-10 ENCOUNTER — Encounter: Payer: Self-pay | Admitting: Physician Assistant

## 2022-06-10 ENCOUNTER — Ambulatory Visit (INDEPENDENT_AMBULATORY_CARE_PROVIDER_SITE_OTHER): Payer: Medicaid Other

## 2022-06-10 ENCOUNTER — Ambulatory Visit (INDEPENDENT_AMBULATORY_CARE_PROVIDER_SITE_OTHER): Payer: Medicaid Other | Admitting: Physician Assistant

## 2022-06-10 DIAGNOSIS — M25561 Pain in right knee: Secondary | ICD-10-CM

## 2022-06-10 DIAGNOSIS — G8929 Other chronic pain: Secondary | ICD-10-CM | POA: Diagnosis not present

## 2022-06-10 MED ORDER — MELOXICAM 15 MG PO TABS: 15.0000 mg | ORAL_TABLET | Freq: Every day | ORAL | 0 refills | Status: DC

## 2022-06-10 NOTE — Progress Notes (Signed)
Office Visit Note   Patient: Terri Duran           Date of Birth: May 05, 1976           MRN: [id number] Visit Date: 06/10/2022              Requested by: Ladell Pier, MD [address] [place],  [place] [address] PCP: Ladell Pier, MD  Chief Complaint  Patient presents with   Right Knee - Pain      HPI: Terri Duran is a pleasant 46 year old woman with a 2-year history of right knee pain.  She thinks this may have happened when she had a fall.  She has tried taking oral anti-inflammatories ice and heat.  Has also tried topical anti-inflammatories.  She gets mechanical popping and catching in her knee which causes immediate pain.  Not necessarily related to weightbearing.  Pain in mechanical symptoms are more lateral and posterior lateral  Assessment & Plan: Visit Diagnoses:  1. Chronic pain of right knee     Plan: Exam today is reassuring she does not have any effusion erythema she has good stability.  She does have tenderness over the lateral joint line.  Her x-rays do demonstrate some joint space narrowing but more in the medial than the lateral joint.  She does have more of a valgus clinical alignment.  Given her mechanical symptoms and no improvement in 2 years I would like to go forward and get an MRI of her right knee.  If this showed a meniscus tear but minimal arthritic changes could consider an arthroscopy.  If it showed a lot of degenerative changes would encourage her to try an injection first.  Did offer her neck injection today but she declined would like to try and get an MRI  Follow-Up Instructions: Return in about 3 weeks (around 07/01/2022).   Ortho Exam  Patient is alert, oriented, no adenopathy, well-dressed, normal affect, normal respiratory effort. Examination of her right knee no effusion no erythema.  She does have slight valgus alignment.  She is acutely tender over the lateral joint line.  Some grinding at the patellofemoral joint with  range of motion.  No tenderness medially good varus valgus stability good endpoint on anterior draw compartments are soft and nontender of the lower leg she is neurovascular intact  Imaging: No results found. No images are attached to the encounter.  Labs: Lab Results  Component Value Date   HGBA1C 5.8 (H) 08/04/2021   HGBA1C 5.7 (H) 03/05/2015   HGBA1C 5.8 (H) 11/21/2013   REPTSTATUS 03/08/2010 FINAL 03/06/2010   CULT  03/06/2010    Multiple bacterial morphotypes present, none predominant. Suggest appropriate recollection if clinically indicated.     Lab Results  Component Value Date   ALBUMIN 3.9 08/04/2021   ALBUMIN 3.7 03/05/2015   ALBUMIN 3.8 11/21/2013    No results found for: "MG" No results found for: "VD25OH"  No results found for: "PREALBUMIN"    Latest Ref Rng & Units 08/04/2021    9:59 AM 11/12/2016   12:12 AM 03/05/2015    4:23 PM  CBC EXTENDED  WBC 3.4 - 10.8 x10E3/uL 6.0  6.4  8.5   RBC 3.77 - 5.28 x10E6/uL 4.54  3.82  4.61   Hemoglobin 11.1 - 15.9 g/dL 10.1  10.2  12.7   HCT 34.0 - 46.6 % 32.8  31.9  40.1   Platelets 150 - 450 x10E3/uL 301  268  249  NEUT# 1.7 - 7.7 K/uL   4.1   Lymph# 0.7 - 4.0 K/uL   3.7      There is no height or weight on file to calculate BMI.  Orders:  Orders Placed This Encounter  Procedures   XR KNEE 3 VIEW RIGHT   MR Knee Right w/o contrast   Meds ordered this encounter  Medications   meloxicam (MOBIC) 15 MG tablet    Sig: Take 1 tablet (15 mg total) by mouth daily.    Dispense:  30 tablet    Refill:  0     Procedures: No procedures performed  Clinical Data: No additional findings.  ROS:  All other systems negative, except as noted in the HPI. Review of Systems  Objective: Vital Signs: LMP 06/01/2022   Specialty Comments:  No specialty comments available.  PMFS History: Patient Active Problem List   Diagnosis Date Noted   Neuropathy of left lower extremity 03/24/2020   Pain in right knee  03/24/2020   Chronic bilateral low back pain without sciatica 03/24/2020   Morbid obesity ([place]) 03/05/2015   Family history of diabetes mellitus (DM) 11/21/2013   High BMI 11/21/2013   Smoker 11/21/2013   Past Medical History:  Diagnosis Date   BV (bacterial vaginosis)    GERD (gastroesophageal reflux disease)     Family History  Problem Relation Age of Onset   Diabetes Mother    Hypertension Mother    Hyperlipidemia Mother    Breast cancer Maternal Aunt     Past Surgical History:  Procedure Laterality Date   DILATION AND CURETTAGE OF UTERUS     TUBAL LIGATION     Social History   Occupational History   Not on file  Tobacco Use   Smoking status: Every Day    Packs/day: 0.50    Types: Cigarettes    Passive exposure: Never   Smokeless tobacco: Never  Vaping Use   Vaping Use: Every day   Substances: Nicotine, Flavoring  Substance and Sexual Activity   Alcohol use: No    Alcohol/week: 0.0 standard drinks of alcohol    Comment: occassional   Drug use: No   Sexual activity: Yes    Partners: Male    Birth control/protection: Surgical    Comment: Tubal Ligation

## 2022-06-24 ENCOUNTER — Ambulatory Visit (HOSPITAL_COMMUNITY): Payer: Medicaid Other | Admitting: Student

## 2022-06-30 DIAGNOSIS — Z419 Encounter for procedure for purposes other than remedying health state, unspecified: Secondary | ICD-10-CM | POA: Diagnosis not present

## 2022-07-04 ENCOUNTER — Other Ambulatory Visit: Payer: Medicaid Other

## 2022-07-30 DIAGNOSIS — Z419 Encounter for procedure for purposes other than remedying health state, unspecified: Secondary | ICD-10-CM | POA: Diagnosis not present

## 2022-08-08 ENCOUNTER — Ambulatory Visit
Admission: EM | Admit: 2022-08-08 | Discharge: 2022-08-08 | Discharge status: Against Medical Advice | Payer: Medicaid Other

## 2022-08-09 ENCOUNTER — Ambulatory Visit
Admission: EM | Admit: 2022-08-09 | Discharge: 2022-08-09 | Disposition: A | Discharge status: Home / Self Care | Payer: Medicaid Other | Attending: Nurse Practitioner | Admitting: Nurse Practitioner

## 2022-08-09 VITALS — BP 128/78 | HR 84 | Temp 98.5°F | Resp 16

## 2022-08-09 DIAGNOSIS — M25561 Pain in right knee: Secondary | ICD-10-CM

## 2022-08-09 DIAGNOSIS — G8929 Other chronic pain: Secondary | ICD-10-CM | POA: Diagnosis not present

## 2022-08-09 MED ORDER — MELOXICAM 7.5 MG PO TABS: 7.5000 mg | ORAL_TABLET | Freq: Two times a day (BID) | ORAL | 0 refills | Status: AC

## 2022-08-09 NOTE — ED Provider Notes (Signed)
[facility]    CSN: [id number] Arrival date & time: 08/09/22  0849      History   Chief Complaint Chief Complaint  Patient presents with   Knee Pain    Right leg and knee issues preventing me from working - Entered by patient    HPI Terri Duran is a 46 y.o. female presents for evaluation of chronic knee pain.  Patient reports a history of right knee injury several years ago and has since had intermittent right knee pain.  She reports over the past couple weeks it is flared up again.  Reports anterior knee pain that is worse with weightbearing.  Endorses tingling to the area but denies numbness, weakness, swelling, bruising.  Denies any known injury or inciting event.  She has been following up with orthopedist and has an MRI pending insurance approval.  States she had an x-ray in October that was normal.  She has been taking OTC analgesics with no relief.  He reports has been on prednisone in the past that did not help.  No other concerns at this time.   Knee Pain   Past Medical History:  Diagnosis Date   BV (bacterial vaginosis)    GERD (gastroesophageal reflux disease)     Patient Active Problem List   Diagnosis Date Noted   Neuropathy of left lower extremity 03/24/2020   Pain in right knee 03/24/2020   Chronic bilateral low back pain without sciatica 03/24/2020   Morbid obesity ([place]) 03/05/2015   Family history of diabetes mellitus (DM) 11/21/2013   High BMI 11/21/2013   Smoker 11/21/2013    Past Surgical History:  Procedure Laterality Date   DILATION AND CURETTAGE OF UTERUS     TUBAL LIGATION      OB History     Gravida  3   Para  2   Term  2   Preterm      AB  1   Living  2      SAB  1   IAB      Ectopic      Multiple      Live Births  2            Home Medications    Prior to Admission medications   Medication Sig Start Date End Date Taking? Authorizing Provider  meloxicam (MOBIC) 7.5 MG tablet Take 1 tablet  (7.5 mg total) by mouth in the morning and at bedtime for 10 days. 08/09/22 08/19/22 Yes Melynda Ripple, NP  doxycycline (VIBRA-TABS) 100 MG tablet Take 1 tablet (100 mg total) by mouth 2 (two) times daily. Patient not taking: Reported on 05/06/2022 02/04/22   Brunetta Jeans, PA-C  fluticasone [facility]) 50 MCG/ACT nasal spray Place 2 sprays into both nostrils daily. Patient not taking: Reported on 05/06/2022 02/04/22   Brunetta Jeans, PA-C  hydrOXYzine (ATARAX) 25 MG tablet May use 1-2 tablets PO q6-8 hours PRN for anxiety Patient not taking: Reported on 05/06/2022 03/24/22   Mayers, Loraine Grip, PA-C  Iron Polysacch Cmplx-B12-FA 150-0.025-1 MG CAPS Take 1 capsule by mouth every other day. 08/05/21   [name], MD  traZODone (DESYREL) 50 MG tablet Take 0.5-1 tablets (25-50 mg total) by mouth at bedtime. 05/06/22   Ladell Pier, MD  hydrochlorothiazide (HYDRODIURIL) 25 MG tablet Take 1 tablet (25 mg total) by mouth daily. 04/12/18 07/24/19  Wardell Honour, MD    Family History Family History  Problem Relation Age of Onset  Diabetes Mother    Hypertension Mother    Hyperlipidemia Mother    Breast cancer Maternal Aunt     Social History Social History   Tobacco Use   Smoking status: Every Day    Packs/day: 0.50    Types: Cigarettes    Passive exposure: Never   Smokeless tobacco: Never  Vaping Use   Vaping Use: Every day   Substances: Nicotine, Flavoring  Substance Use Topics   Alcohol use: No    Alcohol/week: 0.0 standard drinks of alcohol    Comment: occassional   Drug use: No     Allergies   Patient has no known allergies.   Review of Systems Review of Systems  Musculoskeletal:        Right knee pain      Physical Exam Triage Vital Signs ED Triage Vitals  Enc Vitals Group     BP 08/09/22 0904 128/78     Pulse Rate 08/09/22 0904 84     Resp 08/09/22 0904 16     Temp 08/09/22 0904 98.5 F (36.9 C)     Temp Source 08/09/22 0904 Oral     SpO2 08/09/22 0904  97 %     Weight --      Height --      Head Circumference --      Peak Flow --      Pain Score 08/09/22 0905 8     Pain Loc --      Pain Edu? --      Excl. in [place]? --    No data found.  Updated Vital Signs BP 128/78 (BP Location: Left Arm)   Pulse 84   Temp 98.5 F (36.9 C) (Oral)   Resp 16   SpO2 97%   Visual Acuity Right Eye Distance:   Left Eye Distance:   Bilateral Distance:    Right Eye Near:   Left Eye Near:    Bilateral Near:     Physical Exam Vitals and nursing note reviewed.  Constitutional:      Appearance: Normal appearance.  HENT:     Head: Normocephalic and atraumatic.  Eyes:     Pupils: Pupils are equal, round, and reactive to light.  Cardiovascular:     Rate and Rhythm: Normal rate.  Pulmonary:     Effort: Pulmonary effort is normal.  Musculoskeletal:     Right knee: No swelling, deformity, effusion, erythema or ecchymosis. Normal range of motion. Tenderness present over the medial joint line and lateral joint line.     Comments: +valgus stress test. FROM without pain. Strength 5/5 BLE  Skin:    General: Skin is warm and dry.  Neurological:     General: No focal deficit present.     Mental Status: She is alert and oriented to person, place, and time.  Psychiatric:        Mood and Affect: Mood normal.        Behavior: Behavior normal.      UC Treatments / Results  Labs (all labs ordered are listed, but only abnormal results are displayed) Labs Reviewed - No data to display  EKG   Radiology No results found.  Procedures Procedures (including critical care time)  Medications Ordered in UC Medications - No data to display  Initial Impression / Assessment and Plan / UC Course  I have reviewed the triage vital signs and the nursing notes.  Pertinent labs & imaging results that were available during my care of the  patient were reviewed by me and considered in my medical decision making (see chart for details).     Reviewed exam  and symptoms with patient Trial of Mobic RICE therapy and patient fitted with knee braces clinic Follow-up with orthopedist for further treatment options ER for any worsening symptoms Final Clinical Impressions(s) / UC Diagnoses   Final diagnoses:  Chronic pain of right knee   Discharge Instructions   None    ED Prescriptions     Medication Sig Dispense Auth. Provider   meloxicam (MOBIC) 7.5 MG tablet Take 1 tablet (7.5 mg total) by mouth in the morning and at bedtime for 10 days. 20 tablet Melynda Ripple, NP      PDMP not reviewed this encounter.   Melynda Ripple, NP 08/09/22 [phone]

## 2022-08-09 NOTE — Discharge Instructions (Signed)
Mobic twice daily for 10 days Knee brace as needed Rest, elevate, ice to the knee as needed Follow-up with orthopedist for further treatment options I hope you feel better soon!

## 2022-08-09 NOTE — ED Triage Notes (Signed)
Pt c/o right knee pain onset ~ 1-2 weeks ago  Reports hx of sciatic nerve condition. Was tx ~ 1 month ago with some injection given at urgent care states it was not as helpful as prior doses. Cont to discuss tinging sensations in right leg.

## 2022-08-30 DIAGNOSIS — Z419 Encounter for procedure for purposes other than remedying health state, unspecified: Secondary | ICD-10-CM | POA: Diagnosis not present

## 2022-09-15 ENCOUNTER — Ambulatory Visit: Payer: Medicaid Other | Admitting: Physician Assistant

## 2022-09-20 ENCOUNTER — Ambulatory Visit: Payer: Medicaid Other | Admitting: Internal Medicine

## 2022-09-20 ENCOUNTER — Other Ambulatory Visit: Payer: Self-pay

## 2022-09-20 ENCOUNTER — Encounter: Payer: Self-pay | Admitting: Family Medicine

## 2022-09-20 ENCOUNTER — Ambulatory Visit: Payer: Medicaid Other | Attending: Internal Medicine | Admitting: Family Medicine

## 2022-09-20 VITALS — BP 134/87 | HR 97 | Ht 65.0 in | Wt 282.8 lb

## 2022-09-20 DIAGNOSIS — R7303 Prediabetes: Secondary | ICD-10-CM

## 2022-09-20 LAB — POCT GLYCOSYLATED HEMOGLOBIN (HGB A1C): Hemoglobin A1C: 5.6 % (ref 4.0–5.6)

## 2022-09-20 MED ORDER — OZEMPIC (0.25 OR 0.5 MG/DOSE) 2 MG/3ML ~~LOC~~ SOPN
0.2500 mg | PEN_INJECTOR | SUBCUTANEOUS | 3 refills | Status: DC
  Filled 2022-09-20: qty 3, 28d supply, fill #0

## 2022-09-20 NOTE — Patient Instructions (Signed)

## 2022-09-20 NOTE — Progress Notes (Signed)
Subjective:  Patient ID: Brand Males, female    DOB: 11/03/1975  Age: 47 y.o. MRN: [id number]  CC: Weight Loss   HPI Terri Duran is a 47 y.o. year old female patient of Dr. Wynetta Emery with a history of morbid obesity, anxiety and depression, sciatica seen for an acute visit.  Interval History:  She presents because she wanted to have her A1c checked as last A1c was 5.8 in 2022.  She is also looking to commencing a GLP-1 receptor agonist for obesity. 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 She did see Nutrition in the past and did lose some weight but then gained it back.  She is not totally adherent with the diet she received from the nutritionist. Outside of walking at her job she does not get additional exercise.  Past Medical History:  Diagnosis Date   BV (bacterial vaginosis)    GERD (gastroesophageal reflux disease)     Past Surgical History:  Procedure Laterality Date   DILATION AND CURETTAGE OF UTERUS     TUBAL LIGATION      Family History  Problem Relation Age of Onset   Diabetes Mother    Hypertension Mother    Hyperlipidemia Mother    Breast cancer Maternal Aunt     Social History   Socioeconomic History   Marital status: Single    Spouse name: Not on file   Number of children: Not on file   Years of education: Not on file   Highest education level: Not on file  Occupational History   Not on file  Tobacco Use   Smoking status: Every Day    Packs/day: 0.50    Types: Cigarettes    Passive exposure: Never   Smokeless tobacco: Never  Vaping Use   Vaping Use: Every day   Substances: Nicotine, Flavoring  Substance and Sexual Activity   Alcohol use: No    Alcohol/week: 0.0 standard drinks of alcohol    Comment: occassional   Drug use: No   Sexual activity: Yes    Partners: Male    Birth control/protection: Surgical    Comment: Tubal Ligation   Other Topics Concern   Not on  file  Social History Narrative   Not on file   Social Determinants of Health   Financial Resource Strain: Not on file  Food Insecurity: Not on file  Transportation Needs: Not on file  Physical Activity: Not on file  Stress: Not on file  Social Connections: Not on file    No Known Allergies  Outpatient Medications Prior to Visit  Medication Sig Dispense Refill   Iron Polysacch Cmplx-B12-FA 150-0.025-1 MG CAPS Take 1 capsule by mouth every other day. 30 capsule 5   traZODone (DESYREL) 50 MG tablet Take 0.5-1 tablets (25-50 mg total) by mouth at bedtime. 30 tablet 1   doxycycline (VIBRA-TABS) 100 MG tablet Take 1 tablet (100 mg total) by mouth 2 (two) times daily. (Patient not taking: Reported on 05/06/2022) 20 tablet 0   fluticasone (FLONASE) 50 MCG/ACT nasal spray Place 2 sprays into both nostrils daily. (Patient not taking: Reported on 05/06/2022) 16 g 0   hydrOXYzine (ATARAX) 25 MG tablet May use 1-2 tablets PO q6-8 hours PRN for anxiety (Patient not taking: Reported on 05/06/2022) 60 tablet 0   No facility-administered medications prior to visit.     ROS Review of Systems  Constitutional:  Negative for  activity change and appetite change.  HENT:  Negative for sinus pressure and sore throat.   Respiratory:  Negative for chest tightness, shortness of breath and wheezing.   Cardiovascular:  Negative for chest pain and palpitations.  Gastrointestinal:  Negative for abdominal distention, abdominal pain and constipation.  Genitourinary: Negative.   Musculoskeletal: Negative.   Psychiatric/Behavioral:  Negative for behavioral problems and dysphoric mood.     Objective:  BP 134/87   Pulse 97   Ht '5\' 5"'$  (1.651 m)   Wt 282 lb 12.8 oz (128.3 kg)   SpO2 99%   BMI 47.06 kg/m      09/20/2022    4:17 PM 08/09/2022    9:04 AM 06/08/2022   10:20 AM  BP/Weight  Systolic BP 253 664 403  Diastolic BP 87 78 75  Wt. (Lbs) 282.8    BMI 47.06 kg/m2      Wt Readings from Last 3  Encounters:  09/20/22 282 lb 12.8 oz (128.3 kg)  05/06/22 290 lb 3.2 oz (131.6 kg)  03/19/19 278 lb 4.8 oz (126.2 kg)     Physical Exam Constitutional:      Appearance: She is well-developed. She is obese.  Cardiovascular:     Rate and Rhythm: Normal rate.     Heart sounds: Normal heart sounds. No murmur heard. Pulmonary:     Effort: Pulmonary effort is normal.     Breath sounds: Normal breath sounds. No wheezing or rales.  Chest:     Chest wall: No tenderness.  Abdominal:     General: Bowel sounds are normal. There is no distension.     Palpations: Abdomen is soft. There is no mass.     Tenderness: There is no abdominal tenderness.  Musculoskeletal:        General: Normal range of motion.     Right lower leg: No edema.     Left lower leg: No edema.  Neurological:     Mental Status: She is alert and oriented to person, place, and time.  Psychiatric:        Mood and Affect: Mood normal.        Latest Ref Rng & Units 08/04/2021    9:59 AM 03/05/2015    4:23 PM 11/21/2013    2:25 PM  CMP  Glucose 70 - 99 mg/dL 82  91  73   BUN 6 - 24 mg/dL '10  14  12   '$ Creatinine 0.57 - 1.00 mg/dL 0.78  0.84  0.78   Sodium 134 - 144 mmol/L 141  141  139   Potassium 3.5 - 5.2 mmol/L 4.3  4.2  3.7   Chloride 96 - 106 mmol/L 108  106  104   CO2 20 - 29 mmol/L '19  25  26   '$ Calcium 8.7 - 10.2 mg/dL 9.2  9.2  9.1   Total Protein 6.0 - 8.5 g/dL 6.8  6.8  6.9   Total Bilirubin 0.0 - 1.2 mg/dL <0.2  0.2  0.3   Alkaline Phos 44 - 121 IU/L 56  49  54   AST 0 - 40 IU/L '24  15  18   '$ ALT 0 - 32 IU/L '20  14  16     '$ Lipid Panel     Component Value Date/Time   CHOL 174 08/04/2021 0959   TRIG 54 08/04/2021 0959   HDL 72 08/04/2021 0959   LDLCALC 91 08/04/2021 0959    CBC    Component Value Date/Time  WBC 6.0 08/04/2021 0959   WBC 6.4 11/12/2016 0012   RBC 4.54 08/04/2021 0959   RBC 3.82 (L) 11/12/2016 0012   HGB 10.1 (L) 08/04/2021 0959   HCT 32.8 (L) 08/04/2021 0959   PLT 301  08/04/2021 0959   MCV 72 (L) 08/04/2021 0959   MCH 22.2 (L) 08/04/2021 0959   MCH 26.7 11/12/2016 0012   MCHC 30.8 (L) 08/04/2021 0959   MCHC 32.0 11/12/2016 0012   RDW 19.6 (H) 08/04/2021 0959   LYMPHSABS 3.7 03/05/2015 1623   MONOABS 0.6 03/05/2015 1623   EOSABS 0.2 03/05/2015 1623   BASOSABS 0.0 03/05/2015 1623    Lab Results  Component Value Date   HGBA1C 5.6 09/20/2022    Assessment & Plan:  1. Prediabetes Labs reveal prediabetes with an A1c of 5.6.  Working on a low carbohydrate diet, exercise, weight loss is recommended in order to prevent progression to type 2 diabetes mellitus. Advised that I am unsure of coverage of Ozempic with diagnosis of prediabetes and obesity but I will send it to the pharmacy onsite to run a prior authorization - POCT glycosylated hemoglobin (Hb A1C) - Semaglutide,0.25 or 0.'5MG'$ /DOS, (OZEMPIC, 0.25 OR 0.5 MG/DOSE,) 2 MG/3ML SOPN; Inject 0.25 mg into the skin once a week.  Dispense: 3 mL; Refill: 3  2. Morbid obesity ([place]) She will benefit from bariatric surgery but she is not open to this Counseled on gradually increasing physical activity to 150 minutes of moderate intensity exercise per week Counseled on risks, benefits and adverse effects of Ozempic - Semaglutide,0.25 or 0.'5MG'$ /DOS, (OZEMPIC, 0.25 OR 0.5 MG/DOSE,) 2 MG/3ML SOPN; Inject 0.25 mg into the skin once a week.  Dispense: 3 mL; Refill: 3 - CMP14+EGFR    Meds ordered this encounter  Medications   Semaglutide,0.25 or 0.'5MG'$ /DOS, (OZEMPIC, 0.25 OR 0.5 MG/DOSE,) 2 MG/3ML SOPN    Sig: Inject 0.25 mg into the skin once a week.    Dispense:  3 mL    Refill:  3    Follow-up: Return in about 3 months (around 12/20/2022) for Medical conditions with PCP.       Charlott Rakes, MD, FAAFP. [facility] and [facility] [place], [place]   09/20/2022, 4:58 PM

## 2022-09-21 ENCOUNTER — Other Ambulatory Visit: Payer: Self-pay

## 2022-09-21 LAB — CMP14+EGFR
ALT: 17 IU/L (ref 0–32)
AST: 14 IU/L (ref 0–40)
Albumin/Globulin Ratio: 1.3 (ref 1.2–2.2)
Albumin: 3.8 g/dL — ABNORMAL LOW (ref 3.9–4.9)
Alkaline Phosphatase: 60 IU/L (ref 44–121)
BUN/Creatinine Ratio: 16 (ref 9–23)
BUN: 13 mg/dL (ref 6–24)
Bilirubin Total: <0.2 mg/dL (ref 0.0–1.2)
CO2: 22 mmol/L (ref 20–29)
Calcium: 9 mg/dL (ref 8.7–10.2)
Chloride: 106 mmol/L (ref 96–106)
Creatinine, Ser: 0.79 mg/dL (ref 0.57–1.00)
Globulin, Total: 2.9 g/dL (ref 1.5–4.5)
Glucose: 86 mg/dL (ref 70–99)
Potassium: 4.6 mmol/L (ref 3.5–5.2)
Sodium: 142 mmol/L (ref 134–144)
Total Protein: 6.7 g/dL (ref 6.0–8.5)
eGFR: 94 mL/min/{1.73_m2} (ref >59)

## 2022-09-22 ENCOUNTER — Other Ambulatory Visit: Payer: Self-pay

## 2022-09-24 ENCOUNTER — Telehealth: Payer: Self-pay | Admitting: Emergency Medicine

## 2022-09-24 NOTE — Telephone Encounter (Signed)
Noted  

## 2022-09-24 NOTE — Telephone Encounter (Signed)
Copied from [place] [id number]. Topic: General - Other >> Sep 23, 2022  4:36 PM Sabas Sous wrote: Reason for CRM: Pt called requesting an alternative medication to Ozempic that is within her insurance guidelines. Says this message is for Dr. Margarita Rana and not Dr. Wynetta Emery. She is requesting a call back from the clinic to discuss options, she has questions

## 2022-09-24 NOTE — Telephone Encounter (Signed)
Pt is returning Luke's call. Pt requesting a call back from [place] would like to speak with him directly.  Please advise.

## 2022-09-24 NOTE — Telephone Encounter (Signed)
I did explain to her that I was not sure insurance would cover [place]. Can you please reiterate that to her? Thanks

## 2022-09-28 MED ORDER — WEGOVY 0.25 MG/0.5ML ~~LOC~~ SOAJ: 0.2500 mg | SUBCUTANEOUS | 1 refills | Status: DC

## 2022-09-28 NOTE — Telephone Encounter (Signed)
Done. Thank you.

## 2022-09-28 NOTE — Addendum Note (Signed)
Addended by: Charlott Rakes on: 09/28/2022 08:29 AM   Modules accepted: Orders

## 2022-09-29 ENCOUNTER — Ambulatory Visit: Payer: Self-pay | Admitting: *Deleted

## 2022-09-29 NOTE — Telephone Encounter (Signed)
  Chief Complaint: hematuria  Symptoms: pain at end of urine stream, pink when wipes Frequency: started today Pertinent Negatives: Patient denies back/flank pain, abdomen pain, vomiting Disposition: '[]'$ ED /'[]'$ Urgent Care (no appt availability in office) / '[x]'$ Appointment(In office/virtual)/ '[]'$  [facility] [facility]/ '[]'$ Home Care/ '[]'$ Refused Recommended Disposition /'[]'$ [facility]/ '[]'$  Follow-up with PCP Additional Notes: Message sent to office- patient has been scheduled for virtual with office

## 2022-09-29 NOTE — Telephone Encounter (Signed)
Summary: blood i urine   Patient called in has blood in urine and mild pain when urinating         Reason for Disposition  Pain or burning with passing urine  Answer Assessment - Initial Assessment Questions 1. COLOR of URINE: "Describe the color of the urine."  (e.g., tea-colored, pink, red, bloody) "Do you have blood clots in your urine?" (e.g., none, pea, grape, small coin)     Light pink when wipes 2. ONSET: "When did the bleeding start?"      twice 3. EPISODES: "How many times has there been blood in the urine?" or "How many times today?"     2 times 4. PAIN with URINATION: "Is there any pain with passing your urine?" If Yes, ask: "How bad is the pain?"  (Scale 1-10; or mild, moderate, severe)    - MILD: Complains slightly about urination hurting.    - MODERATE: Interferes with normal activities.      - SEVERE: Excruciating, unwilling or unable to urinate because of the pain.      mild 5. FEVER: "Do you have a fever?" If Yes, ask: "What is your temperature, how was it measured, and when did it start?"     no 6. ASSOCIATED SYMPTOMS: "Are you passing urine more frequently than usual?"     Pressure, urgency 7. OTHER SYMPTOMS: "Do you have any other symptoms?" (e.g., back/flank pain, abdomen pain, vomiting)     no 8. PREGNANCY: "Is there any chance you are pregnant?" "When was your last menstrual period?"     no  Protocols used: Urine - Blood In-A-AH

## 2022-09-30 ENCOUNTER — Ambulatory Visit: Payer: Medicaid Other | Attending: Internal Medicine | Admitting: Internal Medicine

## 2022-09-30 DIAGNOSIS — Z419 Encounter for procedure for purposes other than remedying health state, unspecified: Secondary | ICD-10-CM | POA: Diagnosis not present

## 2022-09-30 DIAGNOSIS — N3001 Acute cystitis with hematuria: Secondary | ICD-10-CM

## 2022-09-30 MED ORDER — CIPROFLOXACIN HCL 500 MG PO TABS: 500.0000 mg | ORAL_TABLET | Freq: Two times a day (BID) | ORAL | 0 refills | Status: AC

## 2022-09-30 NOTE — Progress Notes (Signed)
Patient ID: Terri Duran, female   DOB: May 21, 1976, 47 y.o.   MRN: [id number] Virtual Visit via Telephone Note  I connected with Terri Duran on 09/30/2022 at 8:24 AM by telephone and verified that I am speaking with the correct person using two identifiers  Location: Patient: home Provider: office  Participants: Myself Patient   I discussed the limitations, risks, security and privacy concerns of performing an evaluation and management service by telephone and the availability of in person appointments. I also discussed with the patient that there may be a patient responsible charge related to this service. The patient expressed understanding and agreed to proceed.   History of Present Illness: Patient with history of preDM, microcytic anemia, chronic low back pain, Covid infection in February 2022.   Pt c/o dysuria with cloudy urine since yesterday.  Notices a little pink on toilet paper when she wipes. No fever or LBP.    Outpatient Encounter Medications as of 09/30/2022  Medication Sig   doxycycline (VIBRA-TABS) 100 MG tablet Take 1 tablet (100 mg total) by mouth 2 (two) times daily. (Patient not taking: Reported on 05/06/2022)   fluticasone (FLONASE) 50 MCG/ACT nasal spray Place 2 sprays into both nostrils daily. (Patient not taking: Reported on 05/06/2022)   hydrOXYzine (ATARAX) 25 MG tablet May use 1-2 tablets PO q6-8 hours PRN for anxiety (Patient not taking: Reported on 05/06/2022)   Iron Polysacch Cmplx-B12-FA 150-0.025-1 MG CAPS Take 1 capsule by mouth every other day.   Semaglutide-Weight Management (WEGOVY) 0.25 MG/0.5ML SOAJ Inject 0.25 mg into the skin once a week.   traZODone (DESYREL) 50 MG tablet Take 0.5-1 tablets (25-50 mg total) by mouth at bedtime.   [DISCONTINUED] hydrochlorothiazide (HYDRODIURIL) 25 MG tablet Take 1 tablet (25 mg total) by mouth daily.   No facility-administered encounter medications on file as of 09/30/2022.      Observations/Objective: No direct  observation done as this was a telephone visit  Assessment and Plan: 1. Acute cystitis with hematuria Will give 3-day course of ciprofloxacin.  Advised to follow-up if no improvement. - ciprofloxacin (CIPRO) 500 MG tablet; Take 1 tablet (500 mg total) by mouth 2 (two) times daily for 3 days.  Dispense: 6 tablet; Refill: 0   Follow Up Instructions: Follow-up as needed   I discussed the assessment and treatment plan with the patient. The patient was provided an opportunity to ask questions and all were answered. The patient agreed with the plan and demonstrated an understanding of the instructions.   The patient was advised to call back or seek an in-person evaluation if the symptoms worsen or if the condition fails to improve as anticipated.  I  Spent 5 minutes on this telephone encounter  This note has been created with Surveyor, quantity. Any transcriptional errors are unintentional.  Karle Plumber, MD

## 2022-10-03 ENCOUNTER — Other Ambulatory Visit: Payer: Medicaid Other

## 2022-10-12 ENCOUNTER — Other Ambulatory Visit: Payer: Self-pay

## 2022-10-13 ENCOUNTER — Other Ambulatory Visit: Payer: Self-pay

## 2022-10-13 DIAGNOSIS — H5213 Myopia, bilateral: Secondary | ICD-10-CM | POA: Diagnosis not present

## 2022-10-29 DIAGNOSIS — Z419 Encounter for procedure for purposes other than remedying health state, unspecified: Secondary | ICD-10-CM | POA: Diagnosis not present

## 2022-11-05 ENCOUNTER — Ambulatory Visit: Payer: Medicaid Other | Admitting: Nurse Practitioner

## 2022-11-11 ENCOUNTER — Ambulatory Visit (INDEPENDENT_AMBULATORY_CARE_PROVIDER_SITE_OTHER): Payer: Medicaid Other | Admitting: Obstetrics

## 2022-11-11 ENCOUNTER — Other Ambulatory Visit: Payer: Medicaid Other

## 2022-11-11 ENCOUNTER — Encounter: Payer: Self-pay | Admitting: Obstetrics

## 2022-11-11 ENCOUNTER — Other Ambulatory Visit (HOSPITAL_COMMUNITY)
Admission: RE | Admit: 2022-11-11 | Discharge: 2022-11-11 | Disposition: A | Discharge status: Home / Self Care | Payer: Medicaid Other | Source: Ambulatory Visit | Attending: Obstetrics | Admitting: Obstetrics

## 2022-11-11 VITALS — BP 130/76 | HR 79 | Ht 65.0 in | Wt 277.8 lb

## 2022-11-11 DIAGNOSIS — Z01419 Encounter for gynecological examination (general) (routine) without abnormal findings: Secondary | ICD-10-CM

## 2022-11-11 DIAGNOSIS — N926 Irregular menstruation, unspecified: Secondary | ICD-10-CM

## 2022-11-11 DIAGNOSIS — D508 Other iron deficiency anemias: Secondary | ICD-10-CM

## 2022-11-11 DIAGNOSIS — F17210 Nicotine dependence, cigarettes, uncomplicated: Secondary | ICD-10-CM | POA: Diagnosis not present

## 2022-11-11 DIAGNOSIS — Z6841 Body Mass Index (BMI) 40.0 and over, adult: Secondary | ICD-10-CM | POA: Diagnosis not present

## 2022-11-11 DIAGNOSIS — N898 Other specified noninflammatory disorders of vagina: Secondary | ICD-10-CM

## 2022-11-11 DIAGNOSIS — Z1239 Encounter for other screening for malignant neoplasm of breast: Secondary | ICD-10-CM

## 2022-11-11 NOTE — Progress Notes (Signed)
Subjective:        Terri Duran is a 47 y.o. female here for a routine exam.  Current complaints: Vaginal discharge.    Personal health questionnaire:  Is patient [name], have a family history of breast and/or ovarian cancer: no Is there a family history of uterine cancer diagnosed at age < 65, gastrointestinal cancer, urinary tract cancer, family member who is a Field seismologist syndrome-associated carrier: no Is the patient overweight and hypertensive, family history of diabetes, personal history of gestational diabetes, preeclampsia or PCOS: yes Is patient over 15, have PCOS,  family history of premature CHD under age 85, diabetes, smoke, have hypertension or peripheral artery disease:  no At any time, has a partner hit, kicked or otherwise hurt or frightened you?: no Over the past 2 weeks, have you felt down, depressed or hopeless?: no Over the past 2 weeks, have you felt little interest or pleasure in doing things?:no   Gynecologic History No LMP recorded. (Menstrual status: Irregular Periods). Contraception: tubal ligation Last Pap: 2022. Results were: normal Last mammogram: 2022. Results were: normal  Obstetric History OB History  Gravida Para Term Preterm AB Living  '3 2 2   1 2  '$ SAB IAB Ectopic Multiple Live Births  1       2    # Outcome Date GA Lbr Len/2nd Weight Sex Delivery Anes PTL Lv  3 Term 08/14/02 [redacted]w[redacted]d 6 lb 5 oz (2.863 kg) F CS-LTranv EPI  LIV  2 Term 09/07/00 [id number] lb 7 oz (2.92 kg) M CS-LTranv EPI  LIV  1 SAB 1998        DEC    Past Medical History:  Diagnosis Date   BV (bacterial vaginosis)    GERD (gastroesophageal reflux disease)     Past Surgical History:  Procedure Laterality Date   DILATION AND CURETTAGE OF UTERUS     TUBAL LIGATION       Current Outpatient Medications:    Semaglutide-Weight Management (WEGOVY) 0.25 MG/0.5ML SOAJ, Inject 0.25 mg into the skin once a week., Disp: 2 mL, Rfl: 1   traZODone (DESYREL) 50 MG tablet, Take  0.5-1 tablets (25-50 mg total) by mouth at bedtime., Disp: 30 tablet, Rfl: 1   doxycycline (VIBRA-TABS) 100 MG tablet, Take 1 tablet (100 mg total) by mouth 2 (two) times daily. (Patient not taking: Reported on 05/06/2022), Disp: 20 tablet, Rfl: 0   fluticasone (FLONASE) 50 MCG/ACT nasal spray, Place 2 sprays into both nostrils daily. (Patient not taking: Reported on 05/06/2022), Disp: 16 g, Rfl: 0   hydrOXYzine (ATARAX) 25 MG tablet, May use 1-2 tablets PO q6-8 hours PRN for anxiety (Patient not taking: Reported on 05/06/2022), Disp: 60 tablet, Rfl: 0   Iron Polysacch Cmplx-B12-FA 150-0.025-1 MG CAPS, Take 1 capsule by mouth every other day. (Patient not taking: Reported on 11/11/2022), Disp: 30 capsule, Rfl: 5 No Known Allergies  Social History   Tobacco Use   Smoking status: Every Day    Packs/day: .5    Types: Cigarettes    Start date: 02/2022    Passive exposure: Never   Smokeless tobacco: Never  Substance Use Topics   Alcohol use: No    Alcohol/week: 0.0 standard drinks of alcohol    Comment: occassional    Family History  Problem Relation Age of Onset   Diabetes Mother    Hypertension Mother    Hyperlipidemia Mother    Breast cancer Maternal Aunt  Review of Systems  Constitutional: negative for fatigue and weight loss Respiratory: negative for cough and wheezing Cardiovascular: negative for chest pain, fatigue and palpitations Gastrointestinal: negative for abdominal pain and change in bowel habits Musculoskeletal:negative for myalgias Neurological: negative for gait problems and tremors Behavioral/Psych: negative for abusive relationship, depression Endocrine: negative for temperature intolerance    Genitourinary: positive for vaginal discharge.  negative for abnormal menstrual periods, genital lesions, hot flashes, sexual problems  Integument/breast: negative for breast lump, breast tenderness, nipple discharge and skin lesion(s)    Objective:       BP 130/76    Pulse 79   Ht '5\' 5"'$  (1.651 m)   Wt 277 lb 12.8 oz (126 kg)   BMI 46.23 kg/m  General:   Alert and no distress  Skin:   no rash or abnormalities  Lungs:   clear to auscultation bilaterally  Heart:   regular rate and rhythm, S1, S2 normal, no murmur, click, rub or gallop  Breasts:   normal without suspicious masses, skin or nipple changes or axillary nodes  Abdomen:  normal findings: no organomegaly, soft, non-tender and no hernia  Pelvis:  External genitalia: normal general appearance Urinary system: urethral meatus normal and bladder without fullness, nontender Vaginal: normal without tenderness, induration or masses Cervix: normal appearance Adnexa: normal bimanual exam Uterus: anteverted and non-tender, normal size   Lab Review Urine pregnancy test Labs reviewed yes Radiologic studies reviewed yes  I have spent a total of 20 minutes of face-to-face time, excluding clinical staff time, reviewing notes and preparing to see patient, ordering tests and/or medications, and counseling the patient.   Assessment:    1. Encounter for gynecological examination with Papanicolaou smear of cervix Rx: - Cytology - PAP( [facility])  2. Irregular periods  3. Vaginal discharge Rx: - Cervicovaginal ancillary only( [facility])  4. Iron deficiency anemia secondary to inadequate dietary iron intake Rx: - CBC - Ferritin - TSH  5. Screening breast examination Rx: - MM Digital Screening; Future  6. Class 3 severe obesity due to excess calories without serious comorbidity with body mass index (BMI) of 45.0 to 49.9 in adult (HCC) - weight reduction recommended  7. Tobacco dependence due to cigarettes - cessation recommended    Plan:    Education reviewed: calcium supplements, depression evaluation, low fat, low cholesterol diet, safe sex/STD prevention, self breast exams, smoking cessation, and weight bearing exercise. Mammogram ordered. Follow up in: 1 year.    Orders Placed  This Encounter  Procedures   MM Digital Screening    Standing Status:   Future    Standing Expiration Date:   11/11/2023    Order Specific Question:   Reason for Exam (SYMPTOM  OR DIAGNOSIS REQUIRED)    Answer:   Screening    Order Specific Question:   Is the patient pregnant?    Answer:   No    Order Specific Question:   Preferred imaging location?    Answer:   [facility]    [name], MD 11/11/2022 10:15 AM

## 2022-11-11 NOTE — Progress Notes (Signed)
Pt is in the office for annual Last pap 08/04/2021 Last mammogram 08/04/2021 Rpts vaginal discharge and itching Rpts irregular menstrual cycles last, LMP 11/05/22

## 2022-11-12 ENCOUNTER — Other Ambulatory Visit: Payer: Medicaid Other

## 2022-11-12 DIAGNOSIS — D6489 Other specified anemias: Secondary | ICD-10-CM | POA: Diagnosis not present

## 2022-11-12 DIAGNOSIS — D508 Other iron deficiency anemias: Secondary | ICD-10-CM | POA: Diagnosis not present

## 2022-11-12 LAB — CERVICOVAGINAL ANCILLARY ONLY
Bacterial Vaginitis (gardnerella): POSITIVE — AB
Candida Glabrata: NEGATIVE
Candida Vaginitis: POSITIVE — AB
Chlamydia: NEGATIVE
Comment: NEGATIVE
Comment: NEGATIVE
Comment: NEGATIVE
Comment: NEGATIVE
Comment: NEGATIVE
Comment: NORMAL
Neisseria Gonorrhea: NEGATIVE
Trichomonas: NEGATIVE

## 2022-11-13 ENCOUNTER — Other Ambulatory Visit: Payer: Self-pay | Admitting: Obstetrics

## 2022-11-13 DIAGNOSIS — B9689 Other specified bacterial agents as the cause of diseases classified elsewhere: Secondary | ICD-10-CM

## 2022-11-13 DIAGNOSIS — B379 Candidiasis, unspecified: Secondary | ICD-10-CM

## 2022-11-13 LAB — CBC
Hematocrit: 32.7 % — ABNORMAL LOW (ref 34.0–46.6)
Hemoglobin: 9.6 g/dL — ABNORMAL LOW (ref 11.1–15.9)
MCH: 22.9 pg — ABNORMAL LOW (ref 26.6–33.0)
MCHC: 29.4 g/dL — ABNORMAL LOW (ref 31.5–35.7)
MCV: 78 fL — ABNORMAL LOW (ref 79–97)
Platelets: 328 10*3/uL (ref 150–450)
RBC: 4.2 x10E6/uL (ref 3.77–5.28)
RDW: 17.9 % — ABNORMAL HIGH (ref 11.7–15.4)
WBC: 5.9 10*3/uL (ref 3.4–10.8)

## 2022-11-13 LAB — FERRITIN: Ferritin: 8 ng/mL — ABNORMAL LOW (ref 15–150)

## 2022-11-13 LAB — TSH: TSH: 1 u[IU]/mL (ref 0.450–4.500)

## 2022-11-13 MED ORDER — FLUCONAZOLE 150 MG PO TABS: 150.0000 mg | ORAL_TABLET | Freq: Once | ORAL | 0 refills | Status: AC

## 2022-11-13 MED ORDER — METRONIDAZOLE 500 MG PO TABS: 500.0000 mg | ORAL_TABLET | Freq: Two times a day (BID) | ORAL | 2 refills | Status: DC

## 2022-11-15 ENCOUNTER — Other Ambulatory Visit: Payer: Self-pay

## 2022-11-15 ENCOUNTER — Other Ambulatory Visit (HOSPITAL_COMMUNITY): Payer: Self-pay

## 2022-11-15 ENCOUNTER — Encounter (HOSPITAL_COMMUNITY): Payer: Self-pay

## 2022-11-15 LAB — CYTOLOGY - PAP
Comment: NEGATIVE
Diagnosis: NEGATIVE
High risk HPV: NEGATIVE

## 2022-11-16 ENCOUNTER — Telehealth: Payer: Medicaid Other | Admitting: Family Medicine

## 2022-11-16 ENCOUNTER — Ambulatory Visit: Payer: Self-pay | Admitting: *Deleted

## 2022-11-16 DIAGNOSIS — M5430 Sciatica, unspecified side: Secondary | ICD-10-CM

## 2022-11-16 MED ORDER — PREDNISONE 10 MG (21) PO TBPK: ORAL_TABLET | ORAL | 0 refills | Status: DC

## 2022-11-16 NOTE — Patient Instructions (Addendum)
Terri Duran, thank you for joining Perlie Mayo, NP for today's virtual visit.  While this provider is not your primary care provider (PCP), if your PCP is located in our provider database this encounter information will be shared with them immediately following your visit.   [facility] account gives you access to today's visit and all your visits, tests, and labs performed at [facility] " click here if you don't have a [facility] account or go to [email or web address]  Consent: (Patient) Terri Duran provided verbal consent for this virtual visit at the beginning of the encounter.  Current Medications:  Current Outpatient Medications:    predniSONE (STERAPRED UNI-PAK 21 TAB) 10 MG (21) TBPK tablet, Take as directed, Disp: 21 tablet, Rfl: 0   doxycycline (VIBRA-TABS) 100 MG tablet, Take 1 tablet (100 mg total) by mouth 2 (two) times daily. (Patient not taking: Reported on 05/06/2022), Disp: 20 tablet, Rfl: 0   fluticasone (FLONASE) 50 MCG/ACT nasal spray, Place 2 sprays into both nostrils daily. (Patient not taking: Reported on 05/06/2022), Disp: 16 g, Rfl: 0   hydrOXYzine (ATARAX) 25 MG tablet, May use 1-2 tablets PO q6-8 hours PRN for anxiety (Patient not taking: Reported on 05/06/2022), Disp: 60 tablet, Rfl: 0   Iron Polysacch Cmplx-B12-FA 150-0.025-1 MG CAPS, Take 1 capsule by mouth every other day. (Patient not taking: Reported on 11/11/2022), Disp: 30 capsule, Rfl: 5   metroNIDAZOLE (FLAGYL) 500 MG tablet, Take 1 tablet (500 mg total) by mouth 2 (two) times daily., Disp: 14 tablet, Rfl: 2   Semaglutide-Weight Management (WEGOVY) 0.25 MG/0.5ML SOAJ, Inject 0.25 mg into the skin once a week., Disp: 2 mL, Rfl: 1   traZODone (DESYREL) 50 MG tablet, Take 0.5-1 tablets (25-50 mg total) by mouth at bedtime., Disp: 30 tablet, Rfl: 1   Medications ordered in this encounter:  Meds ordered this encounter  Medications   predniSONE (STERAPRED UNI-PAK 21 TAB) 10  MG (21) TBPK tablet    Sig: Take as directed    Dispense:  21 tablet    Refill:  0    Order Specific Question:   Supervising Provider    Answer:   Chase Picket [id number]     *If you need refills on other medications prior to your next appointment, please contact your pharmacy*  Follow-Up: Call back or seek an in-person evaluation if the symptoms worsen or if the condition fails to improve as anticipated.  [place] [phone]  Other Instructions  Sciatica  Sciatica is pain, weakness, tingling, or loss of feeling (numbness) along the sciatic nerve. The sciatic nerve starts in the lower back and goes down the back of each leg. Sciatica usually affects one side of the body. Sciatica usually goes away on its own or with treatment. Sometimes, sciatica may come back. What are the causes? This condition happens when the sciatic nerve is pinched or has pressure put on it. This may be caused by: A disk in between the bones of the spine bulging out too far (herniated disk). Changes in the spinal disks due to aging. A condition that affects a muscle in the butt. Extra bone growth near the sciatic nerve. A break (fracture) of the area between your hip bones (pelvis). Pregnancy. Tumor. This is rare. What increases the risk? You are more likely to develop this condition if you: Play sports that put pressure or stress on the spine. Have poor strength and ease of movement (flexibility). Have  had a back injury or back surgery. Sit for long periods of time. Do activities that involve bending or lifting over and over again. Are very overweight (obese). What are the signs or symptoms? Symptoms can vary from mild to very bad. They may include: Any of these problems in the lower back, leg, hip, or butt: Mild tingling, loss of feeling, or dull aches. A burning feeling. Sharp pains. Loss of feeling in the back of the calf or the sole of the foot. Leg weakness. Very bad  back pain that makes it hard to move. These symptoms may get worse when you cough, sneeze, or laugh. They may also get worse when you sit or stand for long periods of time. How is this treated? This condition often gets better without any treatment. However, treatment may include: Changing or cutting back on physical activity when you have pain. Exercising, including strengthening and stretching. Putting ice or heat on the affected area. Shots of medicines to relieve pain and swelling or to relax your muscles. Surgery. Follow these instructions at home: Medicines Take over-the-counter and prescription medicines only as told by your doctor. Ask your doctor if you should avoid driving or using machines while you are taking your medicine. Managing pain     If told, put ice on the affected area. To do this: Put ice in a plastic bag. Place a towel between your skin and the bag. Leave the ice on for 20 minutes, 2-3 times a day. If your skin turns bright red, take off the ice right away to prevent skin damage. The risk of skin damage is higher if you cannot feel pain, heat, or cold. If told, put heat on the affected area. Do this as often as told by your doctor. Use the heat source that your doctor tells you to use, such as a moist heat pack or a heating pad. Place a towel between your skin and the heat source. Leave the heat on for 20-30 minutes. If your skin turns bright red, take off the heat right away to prevent burns. The risk of burns is higher if you cannot feel pain, heat, or cold. Activity  Return to your normal activities when your doctor says that it is safe. Avoid activities that make your symptoms worse. Take short rests during the day. When you rest for a long time, do some physical activity or stretching between periods of rest. Avoid sitting for a long time without moving. Get up and move around at least one time each hour. Do exercises and stretches as told by your  doctor. Do not lift anything that is heavier than 10 lb (4.5 kg). Avoid lifting heavy things even when you do not have symptoms. Avoid lifting heavy things over and over. When you lift objects, always lift in a way that is safe for your body. To do this, you should: Bend your knees. Keep the object close to your body. Avoid twisting. General instructions Stay at a healthy weight. Wear comfortable shoes that support your feet. Avoid wearing high heels. Avoid sleeping on a mattress that is too soft or too hard. You might have less pain if you sleep on a mattress that is firm enough to support your back. Contact a doctor if: Your pain is not controlled by medicine. Your pain does not get better. Your pain gets worse. Your pain lasts longer than 4 weeks. You lose weight without trying. Get help right away if: You cannot control when you pee (urinate)  or poop (have a bowel movement). You have weakness in any of these areas and it gets worse: Lower back. The area between your hip bones. Butt. Legs. You have redness or swelling of your back. You have a burning feeling when you pee. Summary Sciatica is pain, weakness, tingling, or loss of feeling (numbness) along the sciatic nerve. This may include the lower back, legs, hips, and butt. This condition happens when the sciatic nerve is pinched or has pressure put on it. Treatment often includes rest, exercise, medicines, and putting ice or heat on the affected area. This information is not intended to replace advice given to you by your health care provider. Make sure you discuss any questions you have with your health care provider. Document Revised: 11/23/2021 Document Reviewed: 11/23/2021 Elsevier Patient Education  [place].    If you have been instructed to have an in-person evaluation today at a local Urgent Care facility, please use the link below. It will take you to a list of all of our available [facility], including address, phone number and hours of operation. Please do not delay care.  [facility]  If you or a family member do not have a primary care provider, use the link below to schedule a visit and establish care. When you choose a [facility] primary care physician or advanced practice provider, you gain a long-term partner in health. Find a Primary Care Provider  Learn more about 's in-office and virtual care options: [facility]

## 2022-11-16 NOTE — Progress Notes (Signed)
Virtual Visit Consent   Terri Duran, you are scheduled for a virtual visit with a [facility] provider today. Just as with appointments in the office, your consent must be obtained to participate. Your consent will be active for this visit and any virtual visit you may have with one of our providers in the next 365 days. If you have a MyChart account, a copy of this consent can be sent to you electronically.  As this is a virtual visit, video technology does not allow for your provider to perform a traditional examination. This may limit your provider's ability to fully assess your condition. If your provider identifies any concerns that need to be evaluated in person or the need to arrange testing (such as labs, EKG, etc.), we will make arrangements to do so. Although advances in technology are sophisticated, we cannot ensure that it will always work on either your end or our end. If the connection with a video visit is poor, the visit may have to be switched to a telephone visit. With either a video or telephone visit, we are not always able to ensure that we have a secure connection.  By engaging in this virtual visit, you consent to the provision of healthcare and authorize for your insurance to be billed (if applicable) for the services provided during this visit. Depending on your insurance coverage, you may receive a charge related to this service.  I need to obtain your verbal consent now. Are you willing to proceed with your visit today? Terri Duran has provided verbal consent on 11/16/2022 for a virtual visit (video or telephone). Perlie Mayo, NP  Date: 11/16/2022 10:20 AM  Virtual Visit via Video Note   I, Perlie Mayo, connected with  Terri Duran  ([id number], 11/17/1975) on 11/16/22 at 10:15 AM EDT by a video-enabled telemedicine application and verified that I am speaking with the correct person using two identifiers.  Location: Patient: Virtual Visit Location Patient:  Home Provider: Virtual Visit Location Provider: Home Office   I discussed the limitations of evaluation and management by telemedicine and the availability of in person appointments. The patient expressed understanding and agreed to proceed.    History of Present Illness: Terri Duran is a 47 y.o. who identifies as a female who was assigned female at birth, and is being seen today for sciatic nerve pain.  This flare up started yesterday evening. Unsure of what triggered it. This pain is located in the lower back right side with radiation into the buttock and runs lateral to anterior thigh and lower leg. Pain score today in office is rated 7/10. At its worse the pain is a level 10/10. It has started to disturb sleep and limiting movement. It is relieved by siting at times, laying flat at times, prednisone in past It is aggravated by movement and walking Modifying factors have included tylenol limited help  There is no associated lower extremity numbness or weakness. There is no associated incontinence of stool or urine.   Problems:  Patient Active Problem List   Diagnosis Date Noted   Neuropathy of left lower extremity 03/24/2020   Pain in right knee 03/24/2020   Chronic bilateral low back pain without sciatica 03/24/2020   Morbid obesity ([place]) 03/05/2015   Family history of diabetes mellitus (DM) 11/21/2013   High BMI 11/21/2013   Smoker 11/21/2013    Allergies: No Known Allergies Medications:  Current Outpatient Medications:    doxycycline (VIBRA-TABS) 100 MG  tablet, Take 1 tablet (100 mg total) by mouth 2 (two) times daily. (Patient not taking: Reported on 05/06/2022), Disp: 20 tablet, Rfl: 0   fluticasone (FLONASE) 50 MCG/ACT nasal spray, Place 2 sprays into both nostrils daily. (Patient not taking: Reported on 05/06/2022), Disp: 16 g, Rfl: 0   hydrOXYzine (ATARAX) 25 MG tablet, May use 1-2 tablets PO q6-8 hours PRN for anxiety (Patient not taking: Reported on 05/06/2022), Disp: 60  tablet, Rfl: 0   Iron Polysacch Cmplx-B12-FA 150-0.025-1 MG CAPS, Take 1 capsule by mouth every other day. (Patient not taking: Reported on 11/11/2022), Disp: 30 capsule, Rfl: 5   metroNIDAZOLE (FLAGYL) 500 MG tablet, Take 1 tablet (500 mg total) by mouth 2 (two) times daily., Disp: 14 tablet, Rfl: 2   Semaglutide-Weight Management (WEGOVY) 0.25 MG/0.5ML SOAJ, Inject 0.25 mg into the skin once a week., Disp: 2 mL, Rfl: 1   traZODone (DESYREL) 50 MG tablet, Take 0.5-1 tablets (25-50 mg total) by mouth at bedtime., Disp: 30 tablet, Rfl: 1  Observations/Objective: Patient is well-developed, well-nourished in no acute distress.  Resting comfortably  at home.  Head is normocephalic, atraumatic.  No labored breathing.  Speech is clear and coherent with logical content.  Patient is alert and oriented at baseline.    Assessment and Plan:  1. Sciatic leg pain  - predniSONE (STERAPRED UNI-PAK 21 TAB) 10 MG (21) TBPK tablet; Take as directed  Dispense: 21 tablet; Refill: 0  -rest -work note -avoid heavy lifting and bending and extended periods of sitting  -sciatic stretching and info on avs  Reviewed side effects, risks and benefits of medication.    Patient acknowledged agreement and understanding of the plan.   Past Medical, Surgical, Social History, Allergies, and Medications have been Reviewed.     Follow Up Instructions: I discussed the assessment and treatment plan with the patient. The patient was provided an opportunity to ask questions and all were answered. The patient agreed with the plan and demonstrated an understanding of the instructions.  A copy of instructions were sent to the patient via MyChart unless otherwise noted below.    The patient was advised to call back or seek an in-person evaluation if the symptoms worsen or if the condition fails to improve as anticipated.  Time:  I spent 9 minutes with the patient via telehealth technology discussing the above  problems/concerns.    Perlie Mayo, NP

## 2022-11-16 NOTE — Telephone Encounter (Signed)
  Chief Complaint: flare up of sciatic pain Symptoms: Pain in right lower back with some radiation down right leg Frequency: Started last night.   Missed work as a result on 3rd shift.   This is a chronic problem that flares up intermittently. Pertinent Negatives: Patient denies not being able to walk but it hurts in her right leg Disposition: [] ED /[] Urgent Care (no appt availability in office) / [] Appointment(In office/virtual)/ [x]  [facility]/ [] Home Care/ [] Refused Recommended Disposition /[] [facility]/ []  Follow-up with PCP Additional Notes: No appt. Available at [facility] so I assisted her with scheduling a virtual visit via MyChart for this morning.

## 2022-11-16 NOTE — Telephone Encounter (Signed)
Reason for Disposition  [1] Pain radiates into the thigh or further down the leg AND [2] one leg  Answer Assessment - Initial Assessment Questions 1. ONSET: "When did the pain begin?"      I'm having pain in my sciatic nerve.    Yesterday evening it flared up.     I normally have it. 2. LOCATION: "Where does it hurt?" (upper, mid or lower back)     It's a pain from  my butt and my leg feels tired when I walk.    It hurts.   It's my right leg. 3. SEVERITY: "How bad is the pain?"  (e.g., Scale 1-10; mild, moderate, or severe)   - MILD (1-3): Doesn't interfere with normal activities.    - MODERATE (4-7): Interferes with normal activities or awakens from sleep.    - SEVERE (8-10): Excruciating pain, unable to do any normal activities.      8/10 4. PATTERN: "Is the pain constant?" (e.g., yes, no; constant, intermittent)      It's chronic and intermittent 5. RADIATION: "Does the pain shoot into your legs or somewhere else?"     Yes down my right leg 6. CAUSE:  "What do you think is causing the back pain?"      Sciatic pain.    I've had this for a long time.   In the past I've been prednisone for the inflammation.     7. BACK OVERUSE:  "Any recent lifting of heavy objects, strenuous work or exercise?"     I did go walking and I do checks every 15 minutes on pts.    I ache worse during bad weather.    8. MEDICINES: "What have you taken so far for the pain?" (e.g., nothing, acetaminophen, NSAIDS)     Tylenol not helping 9. NEUROLOGIC SYMPTOMS: "Do you have any weakness, numbness, or problems with bowel/bladder control?"     No 10. OTHER SYMPTOMS: "Do you have any other symptoms?" (e.g., fever, abdomen pain, burning with urination, blood in urine)       Not asked 11. PREGNANCY: "Is there any chance you are pregnant?" "When was your last menstrual period?"       Not asked  Protocols used: Back Pain-A-AH

## 2022-11-17 ENCOUNTER — Telehealth: Payer: Self-pay

## 2022-11-17 ENCOUNTER — Telehealth: Payer: Self-pay | Admitting: Emergency Medicine

## 2022-11-17 NOTE — Telephone Encounter (Signed)
TC to discuss results. Awaiting labs to be reviewed by provider and Rx sent.

## 2022-11-17 NOTE — Telephone Encounter (Signed)
Patient called inquiring about lab results regarding iron. Informed patient will forward to the Dr and nurse who will reach out to her.

## 2022-11-18 ENCOUNTER — Other Ambulatory Visit (HOSPITAL_COMMUNITY): Payer: Self-pay

## 2022-11-18 ENCOUNTER — Other Ambulatory Visit: Payer: Self-pay

## 2022-11-18 ENCOUNTER — Telehealth: Payer: Self-pay | Admitting: Emergency Medicine

## 2022-11-18 NOTE — Telephone Encounter (Signed)
Incoming call from patient reporting heavy vaginal bleeding and changing pads about every 30-45 mins. Also painful cramping. Pt states that she can't take ibuprofen because it exacerbates her symptoms.  Pt previously spoke with after hours nurse who advised pt to be seen in ED. Patient declined and desires to see Dr. Jodi Mourning in office. Dr. Jodi Mourning is out of office today.  Pt scheduled with Dr. Jodi Mourning on 3/25.

## 2022-11-22 ENCOUNTER — Ambulatory Visit: Payer: Self-pay | Admitting: Obstetrics

## 2022-11-22 DIAGNOSIS — D508 Other iron deficiency anemias: Secondary | ICD-10-CM

## 2022-11-23 MED ORDER — ACCRUFER 30 MG PO CAPS: 1.0000 | ORAL_CAPSULE | Freq: Two times a day (BID) | ORAL | 3 refills | Status: DC

## 2022-11-25 ENCOUNTER — Inpatient Hospital Stay (HOSPITAL_BASED_OUTPATIENT_CLINIC_OR_DEPARTMENT_OTHER): Admission: RE | Admit: 2022-11-25 | Payer: Medicaid Other | Source: Ambulatory Visit | Admitting: Radiology

## 2022-11-26 ENCOUNTER — Other Ambulatory Visit (HOSPITAL_COMMUNITY): Payer: Self-pay

## 2022-11-28 ENCOUNTER — Other Ambulatory Visit (HOSPITAL_COMMUNITY): Payer: Self-pay

## 2022-11-28 ENCOUNTER — Other Ambulatory Visit: Payer: Self-pay

## 2022-11-29 DIAGNOSIS — Z419 Encounter for procedure for purposes other than remedying health state, unspecified: Secondary | ICD-10-CM | POA: Diagnosis not present

## 2022-11-30 NOTE — Progress Notes (Signed)
No show.  [name], MD 11/30/2022 1:56 PM

## 2022-12-24 ENCOUNTER — Ambulatory Visit: Payer: Medicaid Other | Admitting: Internal Medicine

## 2022-12-29 DIAGNOSIS — Z419 Encounter for procedure for purposes other than remedying health state, unspecified: Secondary | ICD-10-CM | POA: Diagnosis not present

## 2023-01-14 ENCOUNTER — Other Ambulatory Visit (HOSPITAL_BASED_OUTPATIENT_CLINIC_OR_DEPARTMENT_OTHER): Payer: Self-pay

## 2023-01-15 ENCOUNTER — Other Ambulatory Visit (HOSPITAL_BASED_OUTPATIENT_CLINIC_OR_DEPARTMENT_OTHER): Payer: Self-pay

## 2023-01-26 ENCOUNTER — Other Ambulatory Visit (HOSPITAL_BASED_OUTPATIENT_CLINIC_OR_DEPARTMENT_OTHER): Payer: Self-pay

## 2023-01-28 ENCOUNTER — Other Ambulatory Visit (HOSPITAL_COMMUNITY): Payer: Self-pay

## 2023-01-29 DIAGNOSIS — Z419 Encounter for procedure for purposes other than remedying health state, unspecified: Secondary | ICD-10-CM | POA: Diagnosis not present

## 2023-02-08 ENCOUNTER — Other Ambulatory Visit (HOSPITAL_COMMUNITY): Payer: Self-pay

## 2023-02-28 ENCOUNTER — Telehealth: Payer: Medicaid Other | Admitting: Physician Assistant

## 2023-02-28 DIAGNOSIS — B3731 Acute candidiasis of vulva and vagina: Secondary | ICD-10-CM | POA: Diagnosis not present

## 2023-02-28 DIAGNOSIS — Z419 Encounter for procedure for purposes other than remedying health state, unspecified: Secondary | ICD-10-CM | POA: Diagnosis not present

## 2023-02-28 MED ORDER — FLUCONAZOLE 150 MG PO TABS: 150.0000 mg | ORAL_TABLET | ORAL | 0 refills | Status: DC | PRN

## 2023-02-28 NOTE — Progress Notes (Signed)
Virtual Visit Consent   Terri Duran, you are scheduled for a virtual visit with a [facility] provider today. Just as with appointments in the office, your consent must be obtained to participate. Your consent will be active for this visit and any virtual visit you may have with one of our providers in the next 365 days. If you have a MyChart account, a copy of this consent can be sent to you electronically.  As this is a virtual visit, video technology does not allow for your provider to perform a traditional examination. This may limit your provider's ability to fully assess your condition. If your provider identifies any concerns that need to be evaluated in person or the need to arrange testing (such as labs, EKG, etc.), we will make arrangements to do so. Although advances in technology are sophisticated, we cannot ensure that it will always work on either your end or our end. If the connection with a video visit is poor, the visit may have to be switched to a telephone visit. With either a video or telephone visit, we are not always able to ensure that we have a secure connection.  By engaging in this virtual visit, you consent to the provision of healthcare and authorize for your insurance to be billed (if applicable) for the services provided during this visit. Depending on your insurance coverage, you may receive a charge related to this service.  I need to obtain your verbal consent now. Are you willing to proceed with your visit today? Terri Duran has provided verbal consent on 02/28/2023 for a virtual visit (video or telephone). Terri Loveless, PA-C  Date: 02/28/2023 4:08 PM  Virtual Visit via Video Note   I, Terri Duran, connected with  Terri Duran  ([id number], [birth year]) on 02/28/23 at  4:00 PM EDT by a video-enabled telemedicine application and verified that I am speaking with the correct person using two identifiers.  Location: Patient: Virtual Visit Location  Patient: Home Provider: Virtual Visit Location Provider: Home Office   I discussed the limitations of evaluation and management by telemedicine and the availability of in person appointments. The patient expressed understanding and agreed to proceed.    History of Present Illness: Terri Duran is a 47 y.o. who identifies as a female who was assigned female at birth, and is being seen today for vaginal discharge.  HPI: Vaginal Discharge The patient's primary symptoms include genital itching and vaginal discharge. The patient's pertinent negatives include no genital odor or pelvic pain. This is a new problem. The problem occurs constantly. The problem has been gradually worsening. Pertinent negatives include no back pain, chills, constipation, dysuria, fever, flank pain, frequency, hematuria, nausea or painful intercourse. The vaginal discharge was clear. There has been no bleeding. She has not been passing clots. She has not been passing tissue. Nothing aggravates the symptoms. She has tried nothing for the symptoms. The treatment provided no relief.     Problems:  Patient Active Problem List   Diagnosis Date Noted   Neuropathy of left lower extremity 03/24/2020   Pain in right knee 03/24/2020   Chronic bilateral low back pain without sciatica 03/24/2020   Morbid obesity (HCC) 03/05/2015   Family history of diabetes mellitus (DM) 11/21/2013   High BMI 11/21/2013   Smoker 11/21/2013    Allergies: No Known Allergies Medications:  Current Outpatient Medications:    fluconazole (DIFLUCAN) 150 MG tablet, Take 1 tablet (150 mg total) by mouth every 3 (  three) days as needed., Disp: 2 tablet, Rfl: 0   doxycycline (VIBRA-TABS) 100 MG tablet, Take 1 tablet (100 mg total) by mouth 2 (two) times daily. (Patient not taking: Reported on 05/06/2022), Disp: 20 tablet, Rfl: 0   Ferric Maltol (ACCRUFER) 30 MG CAPS, Take 1 capsule (30 mg total) by mouth 2 (two) times daily before a meal. Take 2 hrs before,  or 2 hrs after a meal., Disp: 60 capsule, Rfl: 3   fluticasone (FLONASE) 50 MCG/ACT nasal spray, Place 2 sprays into both nostrils daily. (Patient not taking: Reported on 05/06/2022), Disp: 16 g, Rfl: 0   hydrOXYzine (ATARAX) 25 MG tablet, May use 1-2 tablets PO q6-8 hours PRN for anxiety (Patient not taking: Reported on 05/06/2022), Disp: 60 tablet, Rfl: 0   Iron Polysacch Cmplx-B12-FA 150-0.025-1 MG CAPS, Take 1 capsule by mouth every other day. (Patient not taking: Reported on 11/11/2022), Disp: 30 capsule, Rfl: 5   metroNIDAZOLE (FLAGYL) 500 MG tablet, Take 1 tablet (500 mg total) by mouth 2 (two) times daily., Disp: 14 tablet, Rfl: 2   predniSONE (STERAPRED UNI-PAK 21 TAB) 10 MG (21) TBPK tablet, Take as directed, Disp: 21 tablet, Rfl: 0   Semaglutide-Weight Management (WEGOVY) 0.25 MG/0.5ML SOAJ, Inject 0.25 mg into the skin once a week., Disp: 2 mL, Rfl: 1   traZODone (DESYREL) 50 MG tablet, Take 0.5-1 tablets (25-50 mg total) by mouth at bedtime., Disp: 30 tablet, Rfl: 1  Observations/Objective: Patient is well-developed, well-nourished in no acute distress.  Resting comfortably at home.  Head is normocephalic, atraumatic.  No labored breathing.  Speech is clear and coherent with logical content.  Patient is alert and oriented at baseline.    Assessment and Plan: 1. Yeast vaginitis - fluconazole (DIFLUCAN) 150 MG tablet; Take 1 tablet (150 mg total) by mouth every 3 (three) days as needed.  Dispense: 2 tablet; Refill: 0  - Symptoms consistent with yeast vaginitis - Fluconazole prescribed - Limit bubble baths, scented lotions/soaps/detergents - Limit tight fitting clothing - Seek on person evaluation if not improving or if symptoms worsen   Follow Up Instructions: I discussed the assessment and treatment plan with the patient. The patient was provided an opportunity to ask questions and all were answered. The patient agreed with the plan and demonstrated an understanding of the  instructions.  A copy of instructions were sent to the patient via MyChart unless otherwise noted below.    The patient was advised to call back or seek an in-person evaluation if the symptoms worsen or if the condition fails to improve as anticipated.  Time:  I spent 8 minutes with the patient via telehealth technology discussing the above problems/concerns.    Terri Loveless, PA-C

## 2023-02-28 NOTE — Patient Instructions (Signed)
Terri Duran, thank you for joining Terri Loveless, PA-C for today's virtual visit.  While this provider is not your primary care provider (PCP), if your PCP is located in our provider database this encounter information will be shared with them immediately following your visit.   A [facility] MyChart account gives you access to today's visit and all your visits, tests, and labs performed at [facility] " click here if you don't have a [facility] MyChart account or go to [email or web address]  Consent: (Patient) Terri Duran provided verbal consent for this virtual visit at the beginning of the encounter.  Current Medications:  Current Outpatient Medications:    fluconazole (DIFLUCAN) 150 MG tablet, Take 1 tablet (150 mg total) by mouth every 3 (three) days as needed., Disp: 2 tablet, Rfl: 0   doxycycline (VIBRA-TABS) 100 MG tablet, Take 1 tablet (100 mg total) by mouth 2 (two) times daily. (Patient not taking: Reported on 05/06/2022), Disp: 20 tablet, Rfl: 0   Ferric Maltol (ACCRUFER) 30 MG CAPS, Take 1 capsule (30 mg total) by mouth 2 (two) times daily before a meal. Take 2 hrs before, or 2 hrs after a meal., Disp: 60 capsule, Rfl: 3   fluticasone (FLONASE) 50 MCG/ACT nasal spray, Place 2 sprays into both nostrils daily. (Patient not taking: Reported on 05/06/2022), Disp: 16 g, Rfl: 0   hydrOXYzine (ATARAX) 25 MG tablet, May use 1-2 tablets PO q6-8 hours PRN for anxiety (Patient not taking: Reported on 05/06/2022), Disp: 60 tablet, Rfl: 0   Iron Polysacch Cmplx-B12-FA 150-0.025-1 MG CAPS, Take 1 capsule by mouth every other day. (Patient not taking: Reported on 11/11/2022), Disp: 30 capsule, Rfl: 5   metroNIDAZOLE (FLAGYL) 500 MG tablet, Take 1 tablet (500 mg total) by mouth 2 (two) times daily., Disp: 14 tablet, Rfl: 2   predniSONE (STERAPRED UNI-PAK 21 TAB) 10 MG (21) TBPK tablet, Take as directed, Disp: 21 tablet, Rfl: 0   Semaglutide-Weight Management (WEGOVY) 0.25  MG/0.5ML SOAJ, Inject 0.25 mg into the skin once a week., Disp: 2 mL, Rfl: 1   traZODone (DESYREL) 50 MG tablet, Take 0.5-1 tablets (25-50 mg total) by mouth at bedtime., Disp: 30 tablet, Rfl: 1   Medications ordered in this encounter:  Meds ordered this encounter  Medications   fluconazole (DIFLUCAN) 150 MG tablet    Sig: Take 1 tablet (150 mg total) by mouth every 3 (three) days as needed.    Dispense:  2 tablet    Refill:  0    Order Specific Question:   Supervising Provider    Answer:   Merrilee Jansky [id number]     *If you need refills on other medications prior to your next appointment, please contact your pharmacy*  Follow-Up: Call back or seek an in-person evaluation if the symptoms worsen or if the condition fails to improve as anticipated.  [facility] [phone]  Other Instructions Vaginal Yeast Infection, Adult  Vaginal yeast infection is a condition that causes vaginal discharge as well as soreness, swelling, and redness (inflammation) of the vagina. This is a common condition. Some women get this infection frequently. What are the causes? This condition is caused by a change in the normal balance of the yeast (Candida) and normal bacteria that live in the vagina. This change causes an overgrowth of yeast, which causes the inflammation. What increases the risk? The condition is more likely to develop in women who: Take antibiotic medicines. Have diabetes. Take birth control pills.  Are pregnant. Douche often. Have a weak body defense system (immune system). Have been taking steroid medicines for a long time. Frequently wear tight clothing. What are the signs or symptoms? Symptoms of this condition include: White, thick, creamy vaginal discharge. Swelling, itching, redness, and irritation of the vagina. The lips of the vagina (labia) may be affected as well. Pain or a burning feeling while urinating. Pain during sex. How is this  diagnosed? This condition is diagnosed based on: Your medical history. A physical exam. A pelvic exam. Your health care provider will examine a sample of your vaginal discharge under a microscope. Your health care provider may send this sample for testing to confirm the diagnosis. How is this treated? This condition is treated with medicine. Medicines may be over-the-counter or prescription. You may be told to use one or more of the following: Medicine that is taken by mouth (orally). Medicine that is applied as a cream (topically). Medicine that is inserted directly into the vagina (suppository). Follow these instructions at home: Take or apply over-the-counter and prescription medicines only as told by your health care provider. Do not use tampons until your health care provider approves. Do not have sex until your infection has cleared. Sex can prolong or worsen your symptoms of infection. Ask your health care provider when it is safe to resume sexual activity. Keep all follow-up visits. This is important. How is this prevented?  Do not wear tight clothes, such as pantyhose or tight pants. Wear breathable cotton underwear. Do not use douches, perfumed soap, creams, or powders. Wipe from front to back after using the toilet. If you have diabetes, keep your blood sugar levels under control. Ask your health care provider for other ways to prevent yeast infections. Contact a health care provider if: You have a fever. Your symptoms go away and then return. Your symptoms do not get better with treatment. Your symptoms get worse. You have new symptoms. You develop blisters in or around your vagina. You have blood coming from your vagina and it is not your menstrual period. You develop pain in your abdomen. Summary Vaginal yeast infection is a condition that causes discharge as well as soreness, swelling, and redness (inflammation) of the vagina. This condition is treated with medicine.  Medicines may be over-the-counter or prescription. Take or apply over-the-counter and prescription medicines only as told by your health care provider. Do not douche. Resume sexual activity or use of tampons as instructed by your health care provider. Contact a health care provider if your symptoms do not get better with treatment or your symptoms go away and then return. This information is not intended to replace advice given to you by your health care provider. Make sure you discuss any questions you have with your health care provider. Document Revised: 11/03/2020 Document Reviewed: 11/03/2020 Elsevier Patient Education  2024 Elsevier Inc.    If you have been instructed to have an in-person evaluation today at a local Urgent Care facility, please use the link below. It will take you to a list of all of our available [facility], including address, phone number and hours of operation. Please do not delay care.  [facility]  If you or a family member do not have a primary care provider, use the link below to schedule a visit and establish care. When you choose a [facility] primary care physician or advanced practice provider, you gain a long-term partner in health. Find a Primary Care Provider  Learn more about [facility] in-office and virtual care options: [facility]

## 2023-03-31 DIAGNOSIS — Z419 Encounter for procedure for purposes other than remedying health state, unspecified: Secondary | ICD-10-CM | POA: Diagnosis not present

## 2023-05-01 DIAGNOSIS — Z419 Encounter for procedure for purposes other than remedying health state, unspecified: Secondary | ICD-10-CM | POA: Diagnosis not present

## 2023-05-31 DIAGNOSIS — Z419 Encounter for procedure for purposes other than remedying health state, unspecified: Secondary | ICD-10-CM | POA: Diagnosis not present

## 2023-07-01 DIAGNOSIS — Z419 Encounter for procedure for purposes other than remedying health state, unspecified: Secondary | ICD-10-CM | POA: Diagnosis not present

## 2023-07-31 DIAGNOSIS — Z419 Encounter for procedure for purposes other than remedying health state, unspecified: Secondary | ICD-10-CM | POA: Diagnosis not present

## 2023-08-31 DIAGNOSIS — Z419 Encounter for procedure for purposes other than remedying health state, unspecified: Secondary | ICD-10-CM | POA: Diagnosis not present

## 2023-10-01 DIAGNOSIS — Z419 Encounter for procedure for purposes other than remedying health state, unspecified: Secondary | ICD-10-CM | POA: Diagnosis not present

## 2023-10-26 ENCOUNTER — Other Ambulatory Visit: Payer: Self-pay

## 2023-10-26 ENCOUNTER — Encounter: Payer: Self-pay | Admitting: Nurse Practitioner

## 2023-10-26 ENCOUNTER — Ambulatory Visit: Payer: 59 | Attending: Nurse Practitioner | Admitting: Nurse Practitioner

## 2023-10-26 VITALS — BP 112/78 | HR 82 | Resp 19 | Ht 65.0 in | Wt 280.0 lb

## 2023-10-26 DIAGNOSIS — D500 Iron deficiency anemia secondary to blood loss (chronic): Secondary | ICD-10-CM

## 2023-10-26 DIAGNOSIS — Z1211 Encounter for screening for malignant neoplasm of colon: Secondary | ICD-10-CM | POA: Diagnosis not present

## 2023-10-26 DIAGNOSIS — R7303 Prediabetes: Secondary | ICD-10-CM

## 2023-10-26 DIAGNOSIS — N921 Excessive and frequent menstruation with irregular cycle: Secondary | ICD-10-CM

## 2023-10-26 DIAGNOSIS — F419 Anxiety disorder, unspecified: Secondary | ICD-10-CM | POA: Diagnosis not present

## 2023-10-26 DIAGNOSIS — F32A Depression, unspecified: Secondary | ICD-10-CM

## 2023-10-26 DIAGNOSIS — F5102 Adjustment insomnia: Secondary | ICD-10-CM | POA: Diagnosis not present

## 2023-10-26 DIAGNOSIS — Z1231 Encounter for screening mammogram for malignant neoplasm of breast: Secondary | ICD-10-CM | POA: Diagnosis not present

## 2023-10-26 MED ORDER — ACCRUFER 30 MG PO CAPS
1.0000 | ORAL_CAPSULE | Freq: Two times a day (BID) | ORAL | 0 refills | Status: AC
  Filled 2023-10-26 – 2023-11-07 (×2): qty 60, 30d supply, fill #0

## 2023-10-26 MED ORDER — TRAZODONE HCL 100 MG PO TABS
100.0000 mg | ORAL_TABLET | Freq: Every day | ORAL | 0 refills | Status: DC
  Filled 2023-10-26: qty 90, 90d supply, fill #0
  Filled 2023-11-08: qty 30, 30d supply, fill #0

## 2023-10-26 NOTE — Progress Notes (Unsigned)
 Assessment & Plan:  Terri Duran was seen today for medical management of chronic issues.  Diagnoses and all orders for this visit:  Prediabetes -     Hemoglobin A1c -     CMP14+EGFR Continue blood sugar control as discussed in office today, low carbohydrate diet, and regular physical exercise as tolerated, 150 minutes per week (30 min each day, 5 days per week, or 50 min 3 days per week). Keep blood sugar logs with fasting goal of 90-130 mg/dl, post prandial (after you eat) less than 180.  For Hypoglycemia: BS <60 and Hyperglycemia BS >400; contact the clinic ASAP. Annual eye exams and foot exams are recommended.   Excessive, frequent and irregular menstruation -     Iron, TIBC and Ferritin Panel  Iron deficiency anemia due to chronic blood loss -     CBC -     Ferric Maltol (ACCRUFER) 30 MG CAPS; Take 1 capsule (30 mg total) by mouth 2 (two) times daily before a meal. Take 2 hrs before, or 2 hrs after a meal.  Adjustment insomnia -     traZODone (DESYREL) 100 MG tablet; Take 1 tablet (100 mg total) by mouth at bedtime.  Encounter for screening mammogram for malignant neoplasm of breast -     Terri 3D SCR MAMMO BILAT BR (aka MM); Future  Obesity, Class III, BMI 40-49.9 (morbid obesity) (HCC) -     Lipid Panel  Anxiety and depression -     Ambulatory referral to Psychiatry  Colon cancer screening -     Ambulatory referral to Gastroenterology    Patient has been counseled on age-appropriate routine health concerns for screening and prevention. These are reviewed and up-to-date. Referrals have been placed accordingly. Immunizations are up-to-date or declined.    Subjective:   Chief Complaint  Patient presents with  . Medical Management of Chronic Issues    Terri Duran presents for routine follow up insomnia and anxiety. Patient states she loss both of her sons tragically in 2023 to gun violence. She continues to grieve their loss with times of sadness and anxiety. She denied  suicidal ideations. Reports sad thoughts and emotions interrupt her sleep. Sleeps approximately 6 hours each night.    Review of Systems  Constitutional: Negative.        Weight gain  HENT: Negative.    Eyes: Negative.   Respiratory: Negative.    Cardiovascular: Negative.   Gastrointestinal: Negative.   Genitourinary: Negative.   Skin: Negative.   Neurological: Negative.   Endo/Heme/Allergies: Negative.   Psychiatric/Behavioral:  Positive for depression. The patient is nervous/anxious.    Past Medical History:  Diagnosis Date  . BV (bacterial vaginosis)   . GERD (gastroesophageal reflux disease)     Past Surgical History:  Procedure Laterality Date  . DILATION AND CURETTAGE OF UTERUS    . TUBAL LIGATION      Family History  Problem Relation Age of Onset  . Diabetes Mother   . Hypertension Mother   . Hyperlipidemia Mother   . Breast cancer Maternal Aunt     Social History Reviewed with no changes to be made today.   Outpatient Medications Prior to Visit  Medication Sig Dispense Refill  . doxycycline (VIBRA-TABS) 100 MG tablet Take 1 tablet (100 mg total) by mouth 2 (two) times daily. (Patient not taking: Reported on 10/26/2023) 20 tablet 0  . Ferric Maltol (ACCRUFER) 30 MG CAPS Take 1 capsule (30 mg total) by mouth 2 (two) times daily before a  meal. Take 2 hrs before, or 2 hrs after a meal. (Patient not taking: Reported on 10/26/2023) 60 capsule 3  . fluconazole (DIFLUCAN) 150 MG tablet Take 1 tablet (150 mg total) by mouth every 3 (three) days as needed. (Patient not taking: Reported on 10/26/2023) 2 tablet 0  . fluticasone (FLONASE) 50 MCG/ACT nasal spray Place 2 sprays into both nostrils daily. (Patient not taking: Reported on 10/26/2023) 16 g 0  . hydrOXYzine (ATARAX) 25 MG tablet May use 1-2 tablets PO q6-8 hours PRN for anxiety (Patient not taking: Reported on 05/06/2022) 60 tablet 0  . Iron Polysacch Cmplx-B12-FA 150-0.025-1 MG CAPS Take 1 capsule by mouth every other  day. (Patient not taking: Reported on 11/11/2022) 30 capsule 5  . metroNIDAZOLE (FLAGYL) 500 MG tablet Take 1 tablet (500 mg total) by mouth 2 (two) times daily. (Patient not taking: Reported on 10/26/2023) 14 tablet 2  . predniSONE (STERAPRED UNI-PAK 21 TAB) 10 MG (21) TBPK tablet Take as directed (Patient not taking: Reported on 10/26/2023) 21 tablet 0  . Semaglutide-Weight Management (WEGOVY) 0.25 MG/0.5ML SOAJ Inject 0.25 mg into the skin once a week. 2 mL 1  . traZODone (DESYREL) 50 MG tablet Take 0.5-1 tablets (25-50 mg total) by mouth at bedtime. (Patient not taking: Reported on 10/26/2023) 30 tablet 1   No facility-administered medications prior to visit.    No Known Allergies     Objective:    BP 112/78 (BP Location: Left Arm, Patient Position: Sitting, Cuff Size: Normal)   Pulse 82   Resp 19   Ht 5\' 5"  (1.651 m)   Wt 280 lb (127 kg)   LMP 09/14/2023 (Approximate)   SpO2 99%   BMI 46.59 kg/m  Wt Readings from Last 3 Encounters:  10/26/23 280 lb (127 kg)  11/11/22 277 lb 12.8 oz (126 kg)  09/20/22 282 lb 12.8 oz (128.3 kg)    Physical Exam Vitals and nursing note reviewed.  Constitutional:      Appearance: Normal appearance. She is obese.  HENT:     Head: Normocephalic.     Right Ear: External ear normal.     Left Ear: External ear normal.  Cardiovascular:     Rate and Rhythm: Normal rate.  Pulmonary:     Effort: Pulmonary effort is normal.     Breath sounds: Normal breath sounds.  Musculoskeletal:     Cervical back: Normal range of motion and neck supple.  Skin:    General: Skin is warm and dry.  Neurological:     Mental Status: She is alert and oriented to person, place, and time.  Psychiatric:        Attention and Perception: Attention normal.        Mood and Affect: Mood is anxious and depressed.        Speech: Speech normal.        Behavior: Behavior normal. Behavior is cooperative.        Thought Content: Thought content normal.        Judgment:  Judgment normal.        Patient has been counseled extensively about nutrition and exercise as well as the importance of adherence with medications and regular follow-up. The patient was given clear instructions to go to ER or return to medical center if symptoms don't improve, worsen or new problems develop. The patient verbalized understanding.   Follow-up: Return in about 6 months (around 04/24/2024).   Claiborne Rigg, FNP-BC [facility]  [place], [place] [phone]   10/26/2023, 5:48 PM

## 2023-10-27 LAB — CMP14+EGFR
ALT: 16 IU/L (ref 0–32)
AST: 16 IU/L (ref 0–40)
Albumin: 3.8 g/dL — ABNORMAL LOW (ref 3.9–4.9)
Alkaline Phosphatase: 63 IU/L (ref 44–121)
BUN/Creatinine Ratio: 16 (ref 9–23)
BUN: 13 mg/dL (ref 6–24)
Bilirubin Total: <0.2 mg/dL (ref 0.0–1.2)
CO2: 23 mmol/L (ref 20–29)
Calcium: 9.1 mg/dL (ref 8.7–10.2)
Chloride: 106 mmol/L (ref 96–106)
Creatinine, Ser: 0.82 mg/dL (ref 0.57–1.00)
Globulin, Total: 2.8 g/dL (ref 1.5–4.5)
Glucose: 81 mg/dL (ref 70–99)
Potassium: 4.7 mmol/L (ref 3.5–5.2)
Sodium: 141 mmol/L (ref 134–144)
Total Protein: 6.6 g/dL (ref 6.0–8.5)
eGFR: 89 mL/min/{1.73_m2} (ref >59)

## 2023-10-27 LAB — LIPID PANEL
Chol/HDL Ratio: 2.3 ratio (ref 0.0–4.4)
Cholesterol, Total: 168 mg/dL (ref 100–199)
HDL: 73 mg/dL (ref >39)
LDL Chol Calc (NIH): 77 mg/dL (ref 0–99)
Triglycerides: 104 mg/dL (ref 0–149)
VLDL Cholesterol Cal: 18 mg/dL (ref 5–40)

## 2023-10-27 LAB — IRON,TIBC AND FERRITIN PANEL
Ferritin: 9 ng/mL — ABNORMAL LOW (ref 15–150)
Iron Saturation: 5 % — CL (ref 15–55)
Iron: 20 ug/dL — ABNORMAL LOW (ref 27–159)
Total Iron Binding Capacity: 401 ug/dL (ref 250–450)
UIBC: 381 ug/dL (ref 131–425)

## 2023-10-27 LAB — CBC
Hematocrit: 34.3 % (ref 34.0–46.6)
Hemoglobin: 10.1 g/dL — ABNORMAL LOW (ref 11.1–15.9)
MCH: 23.2 pg — ABNORMAL LOW (ref 26.6–33.0)
MCHC: 29.4 g/dL — ABNORMAL LOW (ref 31.5–35.7)
MCV: 79 fL (ref 79–97)
Platelets: 334 10*3/uL (ref 150–450)
RBC: 4.36 x10E6/uL (ref 3.77–5.28)
RDW: 17 % — ABNORMAL HIGH (ref 11.7–15.4)
WBC: 6.9 10*3/uL (ref 3.4–10.8)

## 2023-10-27 LAB — HEMOGLOBIN A1C
Est. average glucose Bld gHb Est-mCnc: 117 mg/dL
Hgb A1c MFr Bld: 5.7 % — ABNORMAL HIGH (ref 4.8–5.6)

## 2023-10-28 ENCOUNTER — Encounter: Payer: Self-pay | Admitting: Nurse Practitioner

## 2023-10-28 NOTE — Progress Notes (Signed)
 I have seen and examined this patient with the advanced practice provider STUDENT and agree with the note below

## 2023-10-29 DIAGNOSIS — Z419 Encounter for procedure for purposes other than remedying health state, unspecified: Secondary | ICD-10-CM | POA: Diagnosis not present

## 2023-11-04 ENCOUNTER — Other Ambulatory Visit: Payer: Self-pay

## 2023-11-07 ENCOUNTER — Other Ambulatory Visit: Payer: Self-pay

## 2023-11-08 ENCOUNTER — Inpatient Hospital Stay (HOSPITAL_BASED_OUTPATIENT_CLINIC_OR_DEPARTMENT_OTHER): Admission: RE | Admit: 2023-11-08 | Source: Ambulatory Visit | Admitting: Radiology

## 2023-11-08 ENCOUNTER — Other Ambulatory Visit: Payer: Self-pay

## 2023-11-09 ENCOUNTER — Inpatient Hospital Stay (HOSPITAL_BASED_OUTPATIENT_CLINIC_OR_DEPARTMENT_OTHER): Admission: RE | Admit: 2023-11-09 | Source: Ambulatory Visit | Admitting: Radiology

## 2023-12-10 DIAGNOSIS — Z419 Encounter for procedure for purposes other than remedying health state, unspecified: Secondary | ICD-10-CM | POA: Diagnosis not present

## 2023-12-22 NOTE — Progress Notes (Unsigned)
 Psychiatric Initial Adult Assessment  Patient Identification: Terri Duran MRN:  [id number] Date of Evaluation:  12/23/2023 Referral Source: Winda Hastings, NP  Assessment:  Terri Duran is a 48 y.o. female with no prior formal psychiatric history and medical history of chronic pain, iron  deficiency anemia, and prediabetes who presents to [facility] via video conferencing for initial evaluation of depression and grief.  Patient reports numerous tragic losses throughout her life, most recently the loss of her son and daughter 1 week apart in 2023 to gun violence. Since that time, she reports experiencing days of depressed mood, social withdrawal, behavioral inactivation, and hopelessness although never lasting weeks at a time. Denies SI during these periods. Reports recovery of mood a few days out of the week during which she will reconnect with friends and spend more time outside. She has continued to work although notes work requires more effort and is not as enjoyable as it used to be. Upon further exploration of current symptoms of grief, she identified difficulty trusting others as a result of her loss impacting current relationships as well as feelings of disbelief, loneliness, and distance from others. She denies avoidance behaviors and participates in activities to honor her children. 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 She is not felt to meet criteria for major depressive episode at this time given limited duration of these episodes although is felt to be at risk for this condition.   Reviewed role of therapy in treatment of this condition and that standing psychotropic is not felt indicated at this time. Will continue to reassess for comorbid psychopathology that would warrant standing medication. Will start PRN Atarax  as below to be used for acute anxiety/sleep while starting in therapy.  RTC in 2 months by  video.  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 Past medication trials: none Status of problem: new problem to this provider Interventions: -- START Atarax  12.5-25 mg BID PRN anxiety/sleep -- Risks, benefits, and side effects including but not limited to sedation, dizziness were reviewed with informed consent provided -- Rule out contributing medical conditions:  -- CBC and iron  panel 10/26/23 consistent with iron  deficiency; reports adherence to iron  tablets   -- TSH wnl 11/12/22 -- Scheduled for initial psychotherapy appointment with Paige Cozart, LCSW 01/12/2024   Patient was given contact information for behavioral health clinic and was instructed to call 911 for emergencies.   Subjective:  Chief Complaint:  Chief Complaint  Patient presents with   Medication Management   New Patient (Initial Visit)    History of Present Illness:    Chart review: -- Referred by PCP February 2025 for anxiety and depression.  Tragically lost both children in 2023 to gun violence with associated anxiety, grief, and insomnia. -- Home psychotropics: Trazodone  100 mg nightly (not taking)  Patient initially joins from bed in dark room; responds to request to turn on light.  Has never seen a psychiatrist or been on mental health medications before. Shares that in 2023 she lost both her children to gun violence (59 yo, 23 yo) and since that time has been trying to figure out how to rebuild herself.  Shares she was raised by her aunt and grandma and lived with cousin; mother passed when she was 57 yo. Cousin who she considered like a brother was killed when she was 70 yo.   Feels she has never allowed herself to deal with the grief related to multiple losses although  has mostly relied on meditation and prayer.   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   Describes mood as "sad and depressed" about 3 days out of the week. Denies a period in which depressed mood lasted a few weeks. During these periods, reports social isolation, feeling like a burden to others, decrease in energy and motivation; oversleeping and spending more time in bed; hopelessness; anhedonia; low appetite (although no weight changes). Denies ever reaching a place of passive/active SI due to her spirituality.   Reports remainder of the week will experience days in which she feels "upbeat" and is more hopeful. Will go to the park and try to spend more time outside. May reach out to a few friends. Denies grandiosity or invincability; denies risky/impulsive behaviors. Reports decreased sleep during these periods (may stay up from 7PM-7AM for work and then not go to sleep until 2PM) but reports this is due to relief from depression and desire to make up for lost time.   Upbeat days are usually prompted by achieving something; depressed mood may be triggered by having disagreement with close guy friend.   Explored symptoms related to grief with administration of Inventory of Complicated Grief. During discussion of questions, patient shares that she thinks about her son and daughter every day but doesn't impact her focus or ability to get things done. Denies avoidance of reminders to children. However, reports trouble trusting others and feels that everyone is out to get her/has an ulterior motive. This leads to conflict in relationships and increased irritability/lashing out. She thus isolates herself. Feels she overthinks actions from others due to this mistrust.   Reports drinking 1 bottle wine in a sitting (socially) about once weekly; denies black out. Identifies that etoh may make her more emotional and irritable towards others. Psychoeducation provided on importance of  moderation and risks of heavy alcohol use.  Diagnostic conceptualization discussed. Patient is amenable to focusing on therapy for grief support while monitoring for development of depression. Reviewed standing psychotropic does not appear indicated at this time however she is amenable to trial of PRN Atarax  to manage acute stress and sleep in the interim.  Past Psychiatric History:  Diagnoses: no past formal psychiatric diagnoses Medication trials: denies Previous psychiatrist/therapist: denies Hospitalizations: denies Suicide attempts: denies SIB: denies Hx of violence towards others: denies Current access to guns: denies Hx of trauma/abuse: son and daughter died 1 week apart to gun violence in 2023  Previous Psychotropic Medications: No   Substance Abuse History in the last 12 months:  No.  -- Etoh: 1 bottle wine once week  -- Tobacco: 1 ppd  -- Cannabis/THC/CBD: denies  -- Denies use of stimulants, opioids, BZDs, hallucinogens  Past Medical History:  Past Medical History:  Diagnosis Date   BV (bacterial vaginosis)    GERD (gastroesophageal reflux disease)     Past Surgical History:  Procedure Laterality Date   DILATION AND CURETTAGE OF UTERUS     TUBAL LIGATION      Family Psychiatric History:  Maternal cousin: severe depression  Family History:  Family History  Problem Relation Age of Onset   Diabetes Mother    Hypertension Mother    Hyperlipidemia Mother  Breast cancer Maternal Aunt    Depression Cousin     Social History:   Academic/Vocational: works at [employer]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  Social History   Socioeconomic History   Marital status: Single    Spouse name: Not on file   Number of children: Not on file   Years of education: Not on file   Highest education level: Not on file  Occupational History   Not on file  Tobacco Use   Smoking status: Every Day    Current packs/day: 1.00    Average packs/day: 1 pack/day for 1.8 years (1.8  ttl pk-yrs)    Types: Cigarettes    Start date: 02/2022    Passive exposure: Never   Smokeless tobacco: Never  Vaping Use   Vaping status: Former   Substances: Nicotine, Flavoring  Substance and Sexual Activity   Alcohol use: Yes    Alcohol/week: 5.0 standard drinks of alcohol    Types: 5 Glasses of wine per week   Drug use: No   Sexual activity: Yes    Partners: Male    Birth control/protection: Surgical    Comment: Tubal Ligation   Other Topics Concern   Not on file  Social History Narrative   Not on file   Social Drivers of Health   Financial Resource Strain: Low Risk  (10/26/2023)   Overall Financial Resource Strain (CARDIA)    Difficulty of Paying Living Expenses: Not hard at all  Food Insecurity: No Food Insecurity (10/26/2023)   Hunger Vital Sign    Worried About Running Out of Food in the Last Year: Never true    Ran Out of Food in the Last Year: Never true  Transportation Needs: No Transportation Needs (10/26/2023)   PRAPARE - Administrator, Civil Service (Medical): No    Lack of Transportation (Non-Medical): No  Physical Activity: Insufficiently Active (10/26/2023)   Exercise Vital Sign    Days of Exercise per Week: 1 day    Minutes of Exercise per Session: 60 min  Stress: Stress Concern Present (10/26/2023)   [employer] of Occupational Health - Occupational Stress Questionnaire    Feeling of Stress : To some extent  Social Connections: Socially Isolated (10/26/2023)   Social Connection and Isolation Panel [NHANES]    Frequency of Communication with Friends and Family: Three times a week    Frequency of Social Gatherings with Friends and Family: Once a week    Attends Religious Services: Never    Database administrator or Organizations: No    Attends Engineer, structural: Never    Marital Status: Separated    Additional Social History: updated  Allergies:  No Known Allergies  Current Medications: Current Outpatient Medications   Medication Sig Dispense Refill   Ferric Maltol  (ACCRUFER ) 30 MG CAPS Take 1 capsule (30 mg total) by mouth 2 (two) times daily before a meal. Take 2 hrs before, or 2 hrs after a meal. 60 capsule 0   hydrOXYzine  (ATARAX ) 25 MG tablet Take 0.5-1 tablets (12.5-25 mg total) by mouth 2 (two) times daily as needed (acute anxiety or sleep). 30 tablet 1   traZODone  (DESYREL ) 100 MG tablet Take 1 tablet (100 mg total) by mouth at bedtime. (Patient not taking: Reported on 12/23/2023) 90 tablet 0   No current facility-administered medications for this visit.    ROS: Does not endorse any physical complaints  Objective:  Psychiatric Specialty Exam: There were no vitals taken for this visit.There  is no height or weight on file to calculate BMI.  General Appearance: Casual and Fairly Groomed  Eye Contact:  Good  Speech:  Clear and Coherent and Normal Rate  Volume:  Normal  Mood:   "sad"  Affect:   Dysthymic and tearful at times; able to brighten  Thought Content:  Denies AVH; no overt delusional thought content on interview    Suicidal Thoughts:  No  Homicidal Thoughts:  No  Thought Process:  Goal Directed and Linear  Orientation:  Full (Time, Place, and Person)    Memory: Grossly intact   Judgment:  Good  Insight:  Good  Concentration:  Concentration: Good  Recall:  not formally assessed   Fund of Knowledge: Good  Language: Good  Psychomotor Activity:  Normal  Akathisia:  NA  AIMS (if indicated): NA  Assets:  Communication Skills Desire for Improvement Housing Physical Health Resilience Talents/Skills Transportation Vocational/Educational  ADL's:  Intact  Cognition: WNL  Sleep:  Fair   PE: General: sits comfortably in view of camera; no acute distress  Pulm: no increased work of breathing on room air  MSK: all extremity movements appear intact  Neuro: no focal neurological deficits observed  Gait & Station: unable to assess by video    Metabolic Disorder Labs: Lab Results   Component Value Date   HGBA1C 5.7 (H) 10/26/2023   MPG 117 (H) 03/05/2015   MPG 120 (H) 11/21/2013   No results found for: "PROLACTIN" Lab Results  Component Value Date   CHOL 168 10/26/2023   TRIG 104 10/26/2023   HDL 73 10/26/2023   CHOLHDL 2.3 10/26/2023   LDLCALC 77 10/26/2023   LDLCALC 91 08/04/2021   Lab Results  Component Value Date   TSH 1.000 11/12/2022    Therapeutic Level Labs: No results found for: "LITHIUM" No results found for: "CBMZ" No results found for: "VALPROATE"  Screenings:  GAD-7    Flowsheet Row Office Visit from 10/26/2023 in [facility] - [facility] Dept Of [place]. [facility] Office Visit from 09/20/2022 in [facility] - A Dept Of [place]. [facility] Office Visit from 05/06/2022 in [facility] [place] - A Dept Of [place] [facility]. [facility] Telemedicine from 03[facility]24/2020 in [facility] [place] - A Dept Of [facility]. [facility] Office Visit from 03/19/2019 in [facility]  Total GAD-7 Score 6 1 20  0 0      PHQ2-9    Flowsheet Row Office Visit from 10/26/2023 in [facility] - A Dept Of [place]. [facility] Office Visit from 09/20/2022 in [facility] - A Dept Of [place] [facility]. [facility] Office Visit from 05/06/2022 in [facility] [place] - A Dept Of [place] [facility]. [facility] Telemedicine from 03/24/2020 in [facility] [place] - A Dept Of [place] [facility]. [facility] Nutrition from 04/22/2015 in [facility]  - A Dept Of [place]. [facility]  PHQ-2 Total Score 1 0 6 0 4  PHQ-9 Total Score 4 2 16  -- 12      Flowsheet Row UC from 08/09/2022 in [facility]) UC from 06/08/2022 in [facility])  C-SSRS RISK CATEGORY No Risk No Risk       Administration of Inventory of  Complicated Grief: 12/23/23: score of 28 (> 25 is suggestive of possible prolonged grief disorder)  Collaboration of Care: Collaboration of Care: Medication Management AEB active medication management, Psychiatrist AEB established with this provider, and Referral or follow-up with counselor/therapist AEB scheduled for individual psychotherapy  Patient/Guardian was advised Release of Information must be obtained prior to any record release in order to collaborate their care with an outside provider. Patient/Guardian was advised if they have not already done so to contact the registration department to sign all necessary forms in order for us  to release information regarding their care.   Consent: Patient/Guardian gives verbal consent for treatment and assignment of benefits for services provided during this visit. Patient/Guardian expressed understanding and agreed to proceed.   Televisit via video: I connected with Tita Form on 12/23/23 at 10:00 AM EDT by a video enabled telemedicine application and verified that I am speaking with the correct person using two identifiers.  Location: Patient: home address in [place] Provider: Remote office in [place]   I discussed the limitations of evaluation and management by telemedicine and the availability of in person appointments. The patient expressed understanding and agreed to proceed.  I discussed the assessment and treatment plan with the patient. The patient was provided an opportunity to ask questions and all were answered. The patient agreed with the plan and demonstrated an understanding of the instructions.   The patient was advised to call back or seek an in-person evaluation if the symptoms worsen or if the condition fails to improve as anticipated.  I provided 90 minutes dedicated to the care of this patient via video on the date of this encounter to include chart review, face-to-face time with the patient,  medication management/counseling, brief supportive psychotherapy, administration of psychometric scales.  Pershing Skidmore A Kiaan Overholser 4/25/20251:36 PM

## 2023-12-23 ENCOUNTER — Encounter (HOSPITAL_COMMUNITY): Payer: Self-pay | Admitting: Psychiatry

## 2023-12-23 ENCOUNTER — Ambulatory Visit (HOSPITAL_COMMUNITY): Admitting: Psychiatry

## 2023-12-23 ENCOUNTER — Other Ambulatory Visit: Payer: Self-pay

## 2023-12-23 DIAGNOSIS — F4381 Prolonged grief disorder: Secondary | ICD-10-CM | POA: Diagnosis not present

## 2023-12-23 MED ORDER — HYDROXYZINE HCL 25 MG PO TABS
12.5000 mg | ORAL_TABLET | Freq: Two times a day (BID) | ORAL | 1 refills | Status: AC | PRN
  Filled 2023-12-23: qty 30, 15d supply, fill #0

## 2023-12-23 NOTE — Patient Instructions (Signed)
 Thank you for attending your appointment today.  -- START Atarax  12.5-25 mg twice daily as needed for acute anxiety or sleep -- Continue other medications as prescribed.  Please do not make any changes to medications without first discussing with your provider. If you are experiencing a psychiatric emergency, please call 911 or present to your nearest emergency department. Additional crisis, medication management, and therapy resources are included below.  [facility]  [address], [place], [place] [address] [phone] WALK-IN URGENT CARE 24/7 FOR ANYONE [address], [place], [place]  [phone] Fax: [phone] [email or web address] *Interpreters available *Accepts all insurance and uninsured for Urgent Care needs *Accepts Medicaid and uninsured for outpatient treatment (below)      ONLY FOR [facility]  Below:    Outpatient New Patient Assessment/Therapy Walk-ins:        Monday, Wednesday, and Thursday 8am until slots are full (first come, first served)                   New Patient Psychiatry/Medication Management        Monday-Friday 8am-11am (first come, first served)               For all walk-ins we ask that you arrive by 7:15am, because patients will be seen in the order of arrival.

## 2024-01-06 ENCOUNTER — Ambulatory Visit: Admitting: Obstetrics

## 2024-01-09 DIAGNOSIS — Z419 Encounter for procedure for purposes other than remedying health state, unspecified: Secondary | ICD-10-CM | POA: Diagnosis not present

## 2024-01-12 ENCOUNTER — Ambulatory Visit (HOSPITAL_COMMUNITY): Admitting: Clinical

## 2024-01-12 ENCOUNTER — Encounter (HOSPITAL_COMMUNITY): Payer: Self-pay

## 2024-02-01 ENCOUNTER — Telehealth: Payer: Self-pay | Admitting: Obstetrics

## 2024-02-01 ENCOUNTER — Telehealth: Payer: Self-pay

## 2024-02-01 NOTE — Telephone Encounter (Signed)
 New message    Patient calling C/o abnormal menstrual cycle, asking for a call back from the nurse to discuss.

## 2024-02-01 NOTE — Telephone Encounter (Signed)
 Returned call, pt reports irregular menstrual cycle started 2 weeks ago and was heavy, reports still having spotting. Advised pt to make an appt, transferred to scheduler.

## 2024-02-06 ENCOUNTER — Other Ambulatory Visit (HOSPITAL_COMMUNITY)
Admission: RE | Admit: 2024-02-06 | Discharge: 2024-02-06 | Disposition: A | Discharge status: Home / Self Care | Source: Ambulatory Visit | Attending: Obstetrics | Admitting: Obstetrics

## 2024-02-06 ENCOUNTER — Encounter: Payer: Self-pay | Admitting: Obstetrics

## 2024-02-06 ENCOUNTER — Ambulatory Visit: Admitting: Obstetrics

## 2024-02-06 VITALS — BP 115/75 | HR 78 | Ht 65.0 in | Wt 279.0 lb

## 2024-02-06 DIAGNOSIS — N939 Abnormal uterine and vaginal bleeding, unspecified: Secondary | ICD-10-CM

## 2024-02-06 DIAGNOSIS — Z6841 Body Mass Index (BMI) 40.0 and over, adult: Secondary | ICD-10-CM

## 2024-02-06 DIAGNOSIS — N898 Other specified noninflammatory disorders of vagina: Secondary | ICD-10-CM

## 2024-02-06 DIAGNOSIS — Z01419 Encounter for gynecological examination (general) (routine) without abnormal findings: Secondary | ICD-10-CM | POA: Diagnosis not present

## 2024-02-06 DIAGNOSIS — E66813 Obesity, class 3: Secondary | ICD-10-CM | POA: Diagnosis not present

## 2024-02-06 NOTE — Progress Notes (Signed)
 Pt presents for annual and  irregular periods. Pt states that she started on 5/20 went off on 6/8. Pt last pap was on 03/24 (normal)

## 2024-02-06 NOTE — Progress Notes (Signed)
 Subjective:        Terri Duran is a 48 y.o. female here for a routine exam.  Current complaints: Prolonged menstrual period this past month for nineteen days..  Periods had started to become irregular over the past year, but there was no prolonged or intermenstrual bleeding.  Also c/o vaginal discharge.  Personal health questionnaire:  Is patient [name], have a family history of breast and/or ovarian cancer: no Is there a family history of uterine cancer diagnosed at age < 56, gastrointestinal cancer, urinary tract cancer, family member who is a Personnel officer syndrome-associated carrier: no Is the patient overweight and hypertensive, family history of diabetes, personal history of gestational diabetes, preeclampsia or PCOS: yes Is patient over 82, have PCOS,  family history of premature CHD under age 56, diabetes, smoke, have hypertension or peripheral artery disease:  no At any time, has a partner hit, kicked or otherwise hurt or frightened you?: no Over the past 2 weeks, have you felt down, depressed or hopeless?: no Over the past 2 weeks, have you felt little interest or pleasure in doing things?:no   Gynecologic History Patient's last menstrual period was 01/17/2024. Contraception: tubal ligation Last Pap: 11/11/2022. Results were: normal Last mammogram: 2021. Results were: normal  Obstetric History OB History  Gravida Para Term Preterm AB Living  3 2 2  1 2   SAB IAB Ectopic Multiple Live Births  1    2    # Outcome Date GA Lbr Len/2nd Weight Sex Type Anes PTL Lv  3 Term 08/14/02 [redacted]w[redacted]d  6 lb 5 oz (2.863 kg) F CS-LTranv EPI  LIV  2 Term 09/07/00 [redacted]w[redacted]d  6 lb 7 oz (2.92 kg) M CS-LTranv EPI  LIV  1 SAB 1998        DEC    Past Medical History:  Diagnosis Date   BV (bacterial vaginosis)    GERD (gastroesophageal reflux disease)     Past Surgical History:  Procedure Laterality Date   DILATION AND CURETTAGE OF UTERUS     TUBAL LIGATION       Current Outpatient  Medications:    Ferric Maltol  (ACCRUFER ) 30 MG CAPS, Take 1 capsule (30 mg total) by mouth 2 (two) times daily before a meal. Take 2 hrs before, or 2 hrs after a meal., Disp: 60 capsule, Rfl: 0   hydrOXYzine  (ATARAX ) 25 MG tablet, Take 0.5-1 tablets (12.5-25 mg total) by mouth 2 (two) times daily as needed (acute anxiety or sleep)., Disp: 30 tablet, Rfl: 1   traZODone  (DESYREL ) 100 MG tablet, Take 1 tablet (100 mg total) by mouth at bedtime. (Patient not taking: Reported on 02/06/2024), Disp: 90 tablet, Rfl: 0 No Known Allergies  Social History   Tobacco Use   Smoking status: Every Day    Current packs/day: 1.00    Average packs/day: 1 pack/day for 1.9 years (1.9 ttl pk-yrs)    Types: Cigarettes    Start date: 02/2022    Passive exposure: Never   Smokeless tobacco: Never  Substance Use Topics   Alcohol use: Yes    Alcohol/week: 5.0 standard drinks of alcohol    Types: 5 Glasses of wine per week    Family History  Problem Relation Age of Onset   Diabetes Mother    Hypertension Mother    Hyperlipidemia Mother    Breast cancer Maternal Aunt    Depression Cousin       Review of Systems  Constitutional: negative for fatigue and weight loss  Respiratory: negative for cough and wheezing Cardiovascular: negative for chest pain, fatigue and palpitations Gastrointestinal: negative for abdominal pain and change in bowel habits Musculoskeletal:negative for myalgias Neurological: negative for gait problems and tremors Behavioral/Psych: negative for abusive relationship, depression Endocrine: negative for temperature intolerance    Genitourinary: positive for vaginal discharge.  negative for abnormal menstrual periods, genital lesions, hot flashes, sexual problems  Integument/breast: negative for breast lump, breast tenderness, nipple discharge and skin lesion(s)    Objective:       BP 115/75   Pulse 78   Ht 5\' 5"  (1.651 m)   Wt 279 lb (126.6 kg)   LMP 01/17/2024   BMI 46.43 kg/m   General:   Alert and no distress  Skin:   no rash or abnormalities  Lungs:   clear to auscultation bilaterally  Heart:   regular rate and rhythm, S1, S2 normal, no murmur, click, rub or gallop  Breasts:   normal without suspicious masses, skin or nipple changes or axillary nodes  Abdomen:  normal findings: no organomegaly, soft, non-tender and no hernia  Pelvis:  External genitalia: normal general appearance Urinary system: urethral meatus normal and bladder without fullness, nontender Vaginal: normal without tenderness, induration or masses Cervix: normal appearance Adnexa: normal bimanual exam Uterus: anteverted and non-tender, normal size   Lab Review Urine pregnancy test Labs reviewed yes Radiologic studies reviewed yes  I have spent a total of 20 minutes of face-to-face time, excluding clinical staff time, reviewing notes and preparing to see patient, ordering tests and/or medications, and counseling the patient.   Assessment:    1. Encounter for gynecological examination with Papanicolaou smear of cervix (Primary) Rx: - Cytology - PAP( [place])  2. Vaginal discharge Rx: - Cervicovaginal ancillary only( [place])  3. Abnormal uterine bleeding (AUB) Rx: - US  PELVIC COMPLETE WITH TRANSVAGINAL; Future  4. Class 3 severe obesity due to excess calories without serious comorbidity with body mass index (BMI) of 45.0 to 49.9 in adult - weight reduction with the aid of diet, exercise and behavioral modification recommended      Plan:    Education reviewed: calcium supplements, depression evaluation, low fat, low cholesterol diet, safe sex/STD prevention, self breast exams, and weight bearing exercise. Mammogram ordered. Follow up in: 3 weeks.   Endometrial Biopsy.   Orders Placed This Encounter  Procedures   US  PELVIC COMPLETE WITH TRANSVAGINAL    Standing Status:   Future    Expiration Date:   02/05/2025    Reason for Exam (SYMPTOM  OR DIAGNOSIS REQUIRED):   AUB     Preferred imaging location?:   [facility]    Gabrielle Joiner MD, FACOG Attending Obstetrician & Gynecologist, [facility] for [facility], [facility], [place] 02/06/24

## 2024-02-08 ENCOUNTER — Ambulatory Visit: Payer: Self-pay | Admitting: Obstetrics

## 2024-02-08 ENCOUNTER — Other Ambulatory Visit: Payer: Self-pay

## 2024-02-08 DIAGNOSIS — N760 Acute vaginitis: Secondary | ICD-10-CM

## 2024-02-08 LAB — CERVICOVAGINAL ANCILLARY ONLY
Bacterial Vaginitis (gardnerella): POSITIVE — AB
Candida Glabrata: NEGATIVE
Candida Vaginitis: NEGATIVE
Chlamydia: NEGATIVE
Comment: NEGATIVE
Comment: NEGATIVE
Comment: NEGATIVE
Comment: NEGATIVE
Comment: NEGATIVE
Comment: NORMAL
Neisseria Gonorrhea: NEGATIVE
Trichomonas: NEGATIVE

## 2024-02-08 MED ORDER — METRONIDAZOLE 500 MG PO TABS
500.0000 mg | ORAL_TABLET | Freq: Two times a day (BID) | ORAL | 2 refills | Status: DC
  Filled 2024-02-08 – 2024-03-20 (×3): qty 14, 7d supply, fill #0

## 2024-02-09 DIAGNOSIS — Z419 Encounter for procedure for purposes other than remedying health state, unspecified: Secondary | ICD-10-CM | POA: Diagnosis not present

## 2024-02-09 LAB — CYTOLOGY - PAP
Comment: NEGATIVE
Diagnosis: NEGATIVE
High risk HPV: NEGATIVE

## 2024-02-17 ENCOUNTER — Other Ambulatory Visit: Payer: Self-pay

## 2024-02-20 NOTE — Progress Notes (Unsigned)
 This encounter was created in error - please disregard.

## 2024-02-22 ENCOUNTER — Encounter (HOSPITAL_COMMUNITY): Payer: Self-pay

## 2024-02-22 ENCOUNTER — Encounter (HOSPITAL_COMMUNITY): Admitting: Psychiatry

## 2024-02-22 ENCOUNTER — Telehealth (HOSPITAL_COMMUNITY): Payer: Self-pay | Admitting: Psychiatry

## 2024-02-22 NOTE — Telephone Encounter (Signed)
 Patient connected for virtual psychiatric medication management appointment on 02/22/24 at 10:30AM however joins from work in non-private place. She reports she had forgotten about today's appointment and requests to reschedule.   Rescheduled for medication management on 03/22/24 at 10:30AM by video. Of note, patient missed initial psychotherapy appointment on 01/12/24 and requests call from front desk to reschedule. Per clinic policy, if patient has 1 more no show/late cancellation, she will need to present as walk-in to re-establish care.   Upon brief check-in, she endorses minimal use of PRN hydroxyzine  as it helps her sleep but feels that it leads to too much drowsiness to be used during the day. Has not tried 1/2 tablet (12.5 mg) and encouraged to try this prior to next appointment.   Terri DELENA PUMMEL, MD 02/22/24

## 2024-02-27 ENCOUNTER — Ambulatory Visit: Admitting: Obstetrics and Gynecology

## 2024-03-10 DIAGNOSIS — Z419 Encounter for procedure for purposes other than remedying health state, unspecified: Secondary | ICD-10-CM | POA: Diagnosis not present

## 2024-03-20 ENCOUNTER — Other Ambulatory Visit: Payer: Self-pay

## 2024-03-20 ENCOUNTER — Ambulatory Visit: Admitting: Obstetrics and Gynecology

## 2024-03-20 ENCOUNTER — Other Ambulatory Visit (HOSPITAL_COMMUNITY): Payer: Self-pay

## 2024-03-20 NOTE — Progress Notes (Unsigned)
 BH MD Outpatient Progress Note  03/22/2024 12:41 PM LATYSHA THACKSTON  MRN:  [id number]  Assessment:  Cassius LOISE Dawn presents for follow-up evaluation. Today, 03/22/24, patient reports persistent symptoms of grief particularly as this month is the anniversary of her childrens' passing. Significant portion of visit spent providing supportive listening, empathic validation, and engaging patient in cognitive reframing techniques as she struggles with feelings of blame and regret. She remains future oriented, wanting to learn skills to lead a life not defined by grief. She is scheduled for initial psychotherapy appointment. She also reports symptoms of pervasive anxiety beyond grief leading to sleep and appetite disruption, trouble concentrating, and social withdrawal. She is amenable to start of Zoloft  as below; education provided on risks/benefits, importance of daily adherence, and timeline to effect. No other changes to plan of care at this time.  RTC in 7 weeks by video.   Identifying Information: RUKIA MCGILLIVRAY is a 48 y.o. female with history of prolonged grief disorder, chronic pain, iron  deficiency anemia, and prediabetes who is an established patient with [facility]. On initial evaluation, patient reported numerous tragic losses throughout her life, most recently the loss of her son and daughter 1 week apart in 2023 to gun violence. Since that time, she has experienced days of depressed mood, social withdrawal, behavioral inactivation, and hopelessness although never lasting weeks at a time. Denies SI during these periods with recovery of mood a few days out of the week during which she will reconnect with friends and spend more time outside. She has continued to work although notes work requires more effort and is not as enjoyable as it used to be. Upon further exploration of current symptoms of grief, she identified difficulty trusting others as a result of her loss impacting  current relationships as well as feelings of disbelief, loneliness, and distance from others. She denied avoidance behaviors and participates in activities to honor her children. 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 She is not felt to meet criteria for major depressive episode at this time given limited duration of these episodes although is felt to be at elevated risk for this condition.   Plan:  # GAD  Prolonged grief disorder r/o MDD Past medication trials: Atarax  (sedation) Status of problem: new problem to this provider Interventions: -- START Zoloft  25 mg daily; INCREASE to 50 mg daily after 1 week -- Risks, benefits, and side effects including but not limited to HA, GI upset, sleep change, sexual side effects were reviewed with informed consent provided -- STOP Atarax  12.5-25 mg BID PRN anxiety/sleep given inefficacy -- Rule out contributing medical conditions:             -- CBC and iron  panel 10/26/23 consistent with iron  deficiency; reports adherence to iron  tablets              -- TSH wnl 11/12/22 -- Scheduled for initial psychotherapy appointment with Paige Cozart, LCSW 05/03/24  -- Provided patient with therapist's walk in hours if she desires earlier appt  Patient was given contact information for behavioral health clinic and was instructed to call 911 for emergencies.   Subjective:  Chief Complaint:  Chief Complaint  Patient presents with   Medication Management    Interval History:   Patient reports this month is the anniversary of the death of her children. Continues to work and going overall okay. Switched to day shift about a month ago. Feels this has been  a better schedule for her. Sleeping better although with occasional wake ups at 3AM sometimes due to using the restroom or overthinking.  Becomes tearful sharing about her anxiety - often thinks about her kids' passing but also worries about numerous other things including  relationships, finances, work. This leads to irritability and easy tearfulness. Feels that she has struggled with anxiety even predating loss of her children. She reports often zoning out due to thinking about worries. She describes difficulty letting go of relationships (including ones she should let go of) due to relationships ending prematurely. Reports low appetite when anxious; denies weight loss. Endorses fatigue, muscle tension. Denies panic attacks.   Denies feeling low or sad most days although notes that anxiety certainly impacts mood. When she does feel down, may last a few hours to a day. Rebounds appropriately and identifies music helps her mood recover. Denies passive/active SI. Remains future oriented - I want to feel better, I want my life to be better.  Denies periods of excessively elevated or irritable mood; irritability lasts a few hours and driven by interpersonal stressors.   Reports she often reflects on the day that her children passed; ruminates on regrets of of letting son borrow her car. Continues to experience disbelief at what happened. Often feels she must have done something wrong in life to have lost 2 kids. Denies overt intrusion symptoms.   Has not found hydroxyzine  too helpful - makes her too sleepy.   Amenable to start of standing psychotropic; discussed initiation of Zoloft . Risks/benefits reviewed.   Visit Diagnosis:    ICD-10-CM   1. Prolonged grief disorder  F43.81     2. GAD (generalized anxiety disorder)  F41.1       Past Psychiatric History:  Diagnoses: no past formal psychiatric diagnoses Medication trials: denies Previous psychiatrist/therapist: denies Hospitalizations: denies Suicide attempts: denies SIB: denies Hx of violence towards others: denies Current access to guns: denies Hx of trauma/abuse: son and daughter died 1 week apart to gun violence in 2023 Substance use:              -- Etoh: 1 bottle wine once week             --  Tobacco: 1 ppd             -- Cannabis/THC/CBD: denies             -- Denies use of stimulants, opioids, BZDs, hallucinogens  Past Medical History:  Past Medical History:  Diagnosis Date   BV (bacterial vaginosis)    GERD (gastroesophageal reflux disease)     Past Surgical History:  Procedure Laterality Date   DILATION AND CURETTAGE OF UTERUS     TUBAL LIGATION      Family Psychiatric History:  Maternal cousin: severe depression   Family History:  Family History  Problem Relation Age of Onset   Diabetes Mother    Hypertension Mother    Hyperlipidemia Mother    Breast cancer Maternal Aunt    Depression Cousin     Social History:  Academic/Vocational: works at [employer]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   Social History   Socioeconomic History   Marital status: Single    Spouse name: Not on file   Number of children: Not on file   Years of education: Not on file   Highest education level: Not on file  Occupational History   Not on file  Tobacco Use   Smoking status: Every Day  Current packs/day: 1.00    Average packs/day: 1 pack/day for 2.1 years (2.1 ttl pk-yrs)    Types: Cigarettes    Start date: 02/2022    Passive exposure: Never   Smokeless tobacco: Never  Vaping Use   Vaping status: Former   Substances: Nicotine, Flavoring  Substance and Sexual Activity   Alcohol use: Yes    Alcohol/week: 5.0 standard drinks of alcohol    Types: 5 Glasses of wine per week   Drug use: No   Sexual activity: Yes    Partners: Male    Birth control/protection: Surgical    Comment: Tubal Ligation   Other Topics Concern   Not on file  Social History Narrative   Not on file   Social Drivers of Health   Financial Resource Strain: Low Risk  (10/26/2023)   Overall Financial Resource Strain (CARDIA)    Difficulty of Paying Living Expenses: Not hard at all  Food Insecurity: No Food Insecurity (10/26/2023)   Hunger Vital Sign    Worried About Running Out of Food in the  Last Year: Never true    Ran Out of Food in the Last Year: Never true  Transportation Needs: No Transportation Needs (10/26/2023)   PRAPARE - Administrator, Civil Service (Medical): No    Lack of Transportation (Non-Medical): No  Physical Activity: Insufficiently Active (10/26/2023)   Exercise Vital Sign    Days of Exercise per Week: 1 day    Minutes of Exercise per Session: 60 min  Stress: Stress Concern Present (10/26/2023)   [employer] of Occupational Health - Occupational Stress Questionnaire    Feeling of Stress : To some extent  Social Connections: Socially Isolated (10/26/2023)   Social Connection and Isolation Panel    Frequency of Communication with Friends and Family: Three times a week    Frequency of Social Gatherings with Friends and Family: Once a week    Attends Religious Services: Never    Database administrator or Organizations: No    Attends Engineer, structural: Never    Marital Status: Separated    Allergies: No Known Allergies  Current Medications: Current Outpatient Medications  Medication Sig Dispense Refill   sertraline  (ZOLOFT ) 50 MG tablet Take 1/2 tablet (25 mg) daily for 1 week then increase to 1 tablet (50 mg) daily. 30 tablet 1   Ferric Maltol  (ACCRUFER ) 30 MG CAPS Take 1 capsule (30 mg total) by mouth 2 (two) times daily before a meal. Take 2 hrs before, or 2 hrs after a meal. 60 capsule 0   metroNIDAZOLE  (FLAGYL ) 500 MG tablet Take 1 tablet (500 mg total) by mouth 2 (two) times daily. 14 tablet 2   No current facility-administered medications for this visit.    ROS: See above  Objective:  Psychiatric Specialty Exam: There were no vitals taken for this visit.There is no height or weight on file to calculate BMI.  General Appearance: Casual and Fairly Groomed  Eye Contact:  Good  Speech:  Clear and Coherent and Normal Rate  Volume:  Normal  Mood:  anxious  Affect:  Sad; appropriately tearful however able to  brighten  Thought Content: Denies AVH; no delusional thought content on interview   Suicidal Thoughts:  No  Homicidal Thoughts:  No  Thought Process:  Goal Directed and Linear  Orientation:  Full (Time, Place, and Person)    Memory:  Grossly intact   Judgment:  Good  Insight:  Good  Concentration:  Concentration:  Good  Recall:  not formally assessed   Fund of Knowledge: Good  Language: Good  Psychomotor Activity:  Normal  Akathisia:  NA  AIMS (if indicated): NA  Assets:  Communication Skills Desire for Improvement Housing Physical Health Resilience Talents/Skills Transportation Vocational/Educational  ADL's:  Intact  Cognition: WNL  Sleep:  improving   PE: General: sits comfortably in view of camera; no acute distress  Pulm: no increased work of breathing on room air  MSK: all extremity movements appear intact  Neuro: no focal neurological deficits observed  Gait & Station: unable to assess by video    Metabolic Disorder Labs: Lab Results  Component Value Date   HGBA1C 5.7 (H) 10/26/2023   MPG 117 (H) 03/05/2015   MPG 120 (H) 11/21/2013   No results found for: PROLACTIN Lab Results  Component Value Date   CHOL 168 10/26/2023   TRIG 104 10/26/2023   HDL 73 10/26/2023   CHOLHDL 2.3 10/26/2023   LDLCALC 77 10/26/2023   LDLCALC 91 08/04/2021   Lab Results  Component Value Date   TSH 1.000 11/12/2022   TSH 0.892 08/04/2021    Therapeutic Level Labs: No results found for: LITHIUM No results found for: VALPROATE No results found for: CBMZ  Screenings:  GAD-7    Flowsheet Row Office Visit from 02/06/2024 in [facility] for [facility] at [place] Office Visit from 10/26/2023 in [facility] [place] - A Dept Of [place]. [facility] Office Visit from 09/20/2022 in [facility] - A Dept Of [place] DEL. [facility] Office Visit from 05/06/2022 in [facility] [place] - A Dept Of [place] DEL.  [facility] Telemedicine from 03/24/2020 in [facility] [place] - A Dept Of [place] DEL. [facility]  Total GAD-7 Score 0 6 1 20  0   PHQ2-9    Flowsheet Row Office Visit from 02/06/2024 in [facility] for [facility] at [facility] Visit from 10/26/2023 in [facility] - A Dept Of [place]. [facility] Office Visit from 09/20/2022 in [facility] [place] - A Dept Of [place] DEL. [facility] Office Visit from 05/06/2022 in [facility] [place] - A Dept Of [place] DEL. [facility] Telemedicine from 03/24/2020 in [facility] [place] - A Dept Of [place] DEL. [facility]  PHQ-2 Total Score 0 1 0 6 0  PHQ-9 Total Score 0 4 2 16  --   Flowsheet Row UC from 08/09/2022 in [facility] at [facility]) UC from 06/08/2022 in [facility] at [facility])  C-SSRS RISK CATEGORY No Risk No Risk    Collaboration of Care: Collaboration of Care: Medication Management AEB active medication management, Psychiatrist AEB established with this provider, and Referral or follow-up with counselor/therapist AEB scheduled for initial psychotherapy  Patient/Guardian was advised Release of Information must be obtained prior to any record release in order to collaborate their care with an outside provider. Patient/Guardian was advised if they have not already done so to contact the registration department to sign all necessary forms in order for us  to release information regarding their care.   Consent: Patient/Guardian gives verbal consent for treatment and assignment of benefits for services provided during this visit. Patient/Guardian expressed understanding and agreed to proceed.   Televisit via video: I connected with patient on 03/22/24 at 10:30 AM EDT by  a video enabled telemedicine application and verified that I am speaking with the correct person  using two identifiers.  Location: Patient: home address in [place] Provider: remote office in [place]   I discussed the limitations of evaluation and management by telemedicine and the availability of in person appointments. The patient expressed understanding and agreed to proceed.  I discussed the assessment and treatment plan with the patient. The patient was provided an opportunity to ask questions and all were answered. The patient agreed with the plan and demonstrated an understanding of the instructions.   The patient was advised to call back or seek an in-person evaluation if the symptoms worsen or if the condition fails to improve as anticipated.  I provided 20 minutes dedicated to the care of this patient via video on the date of this encounter to include chart review, face-to-face time with the patient, medication management/counseling, documentation.  Psychotherapy was utilized during today's session from 10:35AM-10:55AM. Therapeutic interventions included empathic listening, supportive therapy, cognitive therapy. Used supportive interviewing techniques to provide emotional validation and psychoeducation provided on grief. Worked on cognitive reframing techniques and challenging negative automatic thoughts.  Improvement was evidenced by patient's participation and identified commitment to therapy goals.   Didier Brandenburg A Jesse Hirst 03/22/2024, 12:41 PM

## 2024-03-22 ENCOUNTER — Telehealth (HOSPITAL_COMMUNITY): Admitting: Psychiatry

## 2024-03-22 ENCOUNTER — Encounter (HOSPITAL_COMMUNITY): Payer: Self-pay | Admitting: Psychiatry

## 2024-03-22 ENCOUNTER — Other Ambulatory Visit: Payer: Self-pay

## 2024-03-22 DIAGNOSIS — F411 Generalized anxiety disorder: Secondary | ICD-10-CM | POA: Diagnosis not present

## 2024-03-22 DIAGNOSIS — F4381 Prolonged grief disorder: Secondary | ICD-10-CM | POA: Diagnosis not present

## 2024-03-22 MED ORDER — SERTRALINE HCL 50 MG PO TABS
ORAL_TABLET | ORAL | 1 refills | Status: DC
  Filled 2024-03-22: qty 30, 33d supply, fill #0

## 2024-03-22 NOTE — Patient Instructions (Addendum)
 Thank you for attending your appointment today.  -- START Zoloft  25 mg daily; increase to 50 mg daily after 1 week -- Continue other medications as prescribed. -- If you have any questions or concerns, our clinic number is [phone]. -- You are scheduled for therapy with Paige Cozart LCSW 9/4 at 10AM by video. Please make sure you are in a private location for this visit. If you would like to be seen earlier, her walk in day is on Wednesdays. I recommend arriving at 7AM to increase your chances of being seen as these slots are first come first serve starting at 8AM.  Please do not make any changes to medications without first discussing with your provider. If you are experiencing a psychiatric emergency, please call 911 or present to your nearest emergency department. Additional crisis, medication management, and therapy resources are included below.  [facility]  [address] [place], [place] [address] [phone] WALK-IN URGENT CARE 24/7 FOR ANYONE [address], [place], [place]  [phone] Fax: [phone] [email or web address] *Interpreters available *Accepts all insurance and uninsured for Urgent Care needs *Accepts Medicaid and uninsured for outpatient treatment (below)      ONLY FOR [facility]  Below:    Outpatient New Patient Assessment/Therapy Walk-ins:        Monday, Wednesday, and Thursday 8am until slots are full (first come, first served)                   New Patient Psychiatry/Medication Management        Monday-Friday 8am-11am (first come, first served)               For all walk-ins we ask that you arrive by 7:15am, because patients will be seen in the order of arrival.

## 2024-03-30 ENCOUNTER — Other Ambulatory Visit: Payer: Self-pay

## 2024-04-02 ENCOUNTER — Other Ambulatory Visit: Payer: Self-pay

## 2024-04-07 ENCOUNTER — Telehealth: Admitting: Physician Assistant

## 2024-04-07 DIAGNOSIS — B9689 Other specified bacterial agents as the cause of diseases classified elsewhere: Secondary | ICD-10-CM

## 2024-04-07 DIAGNOSIS — N760 Acute vaginitis: Secondary | ICD-10-CM | POA: Diagnosis not present

## 2024-04-08 MED ORDER — METRONIDAZOLE 500 MG PO TABS: 500.0000 mg | ORAL_TABLET | Freq: Two times a day (BID) | ORAL | 2 refills | Status: AC

## 2024-04-08 MED ORDER — FLUCONAZOLE 150 MG PO TABS: 150.0000 mg | ORAL_TABLET | ORAL | 0 refills | Status: DC | PRN

## 2024-04-08 NOTE — Addendum Note (Signed)
 Addended by: LAVELL LYE A on: 04/08/2024 07:42 AM   Modules accepted: Orders

## 2024-04-08 NOTE — Progress Notes (Signed)

## 2024-04-10 ENCOUNTER — Other Ambulatory Visit: Payer: Self-pay

## 2024-04-10 DIAGNOSIS — Z419 Encounter for procedure for purposes other than remedying health state, unspecified: Secondary | ICD-10-CM | POA: Diagnosis not present

## 2024-04-10 MED ORDER — FLUCONAZOLE 150 MG PO TABS
150.0000 mg | ORAL_TABLET | ORAL | 0 refills | Status: AC | PRN
  Filled 2024-04-10: qty 2, 6d supply, fill #0

## 2024-04-10 NOTE — Addendum Note (Signed)
 Addended by: GLADIS ELSIE BROCKS on: 04/10/2024 10:09 AM   Modules accepted: Orders

## 2024-04-13 ENCOUNTER — Ambulatory Visit: Admitting: Obstetrics and Gynecology

## 2024-04-24 ENCOUNTER — Ambulatory Visit: Payer: 59 | Admitting: Nurse Practitioner

## 2024-05-01 ENCOUNTER — Encounter (HOSPITAL_COMMUNITY): Payer: Self-pay

## 2024-05-03 ENCOUNTER — Ambulatory Visit (HOSPITAL_COMMUNITY): Admitting: Clinical

## 2024-05-11 DIAGNOSIS — Z419 Encounter for procedure for purposes other than remedying health state, unspecified: Secondary | ICD-10-CM | POA: Diagnosis not present

## 2024-05-16 NOTE — Progress Notes (Unsigned)
 This encounter was created in error - please disregard.

## 2024-05-17 ENCOUNTER — Other Ambulatory Visit: Payer: Self-pay

## 2024-05-17 ENCOUNTER — Encounter (HOSPITAL_COMMUNITY): Admitting: Psychiatry

## 2024-05-17 ENCOUNTER — Encounter (HOSPITAL_COMMUNITY): Payer: Self-pay

## 2024-05-17 ENCOUNTER — Other Ambulatory Visit (HOSPITAL_COMMUNITY): Payer: Self-pay | Admitting: Psychiatry

## 2024-05-17 MED ORDER — SERTRALINE HCL 50 MG PO TABS
50.0000 mg | ORAL_TABLET | Freq: Every day | ORAL | 1 refills | Status: DC
  Filled 2024-05-17: qty 30, 30d supply, fill #0

## 2024-05-17 NOTE — Progress Notes (Signed)
 Patient did not connect for virtual psychiatric medication management appointment on 05/17/24 at 10:30AM. Sent secure video link with no response. Called phone and patient answered reporting to be at work and requesting to reschedule.   Upon brief phone check in, she reports tolerating Zoloft  well and currently taking 50 mg daily. Identifies benefit for mood/anxiety and denies any urgent questions/concerns.  Refill of medication sent to pharmacy to bridge until next available appt on 11/13 at 3PM by video.  [name], MD 05/17/24

## 2024-05-28 ENCOUNTER — Other Ambulatory Visit: Payer: Self-pay

## 2024-07-11 ENCOUNTER — Ambulatory Visit: Payer: Self-pay

## 2024-07-11 NOTE — Progress Notes (Signed)
 Patient did not connect for virtual psychiatric medication management appointment on 07/12/24 at 3PM. Sent secure video link with no response. Called phone and patient answered reporting inability to attend appointment. Reviewed no-show policy and that she will be rescheduled for 1 additional appointment before she is required to present as walk-in. She expressed understanding. Patient was made aware of this provider's departure from [facility] at the end of Nov 2025 and that she will be transitioned to alternative provider in the clinic. Will send in refill of Zoloft  to bridge until next appointment. Front desk to call patient to reschedule with next provider.  [name], MD 07/12/24

## 2024-07-11 NOTE — Telephone Encounter (Signed)
 FYI Only or Action Required?: FYI only for provider: appointment scheduled on 07/12/2024.  Patient was last seen in primary care on 10/26/2023 by Theotis Haze ORN, NP.  Called Nurse Triage reporting Fatigue.  Symptoms began x 2 weeks.  Interventions attempted: Nothing.  Symptoms are: gradually worsening.  Triage Disposition: See Physician Within 24 Hours  Patient/caregiver understands and will follow disposition?: Yes   Copied from CRM [id number]. Topic: Clinical - Red Word Triage >> Jul 11, 2024  9:45 AM Eva FALCON wrote: Red Word that prompted transfer to Nurse Triage: She has been feeling extremely drained and fatigue. Is requesting to have A1C check and mentioned she is anemic. Reason for Disposition  [1] MODERATE weakness (e.g., interferes with work, school, normal activities) AND [2] persists > 3 days  Answer Assessment - Initial Assessment Questions 1. DESCRIPTION: Describe how you are feeling.     Drained, fatigued 2. SEVERITY: How bad is it?  Can you stand and walk?    moderate 3. ONSET: When did these symptoms begin? (e.g., hours, days, weeks, months)      X 2 weeks 4. CAUSE: What do you think is causing the weakness or fatigue? (e.g., not drinking enough fluids, medical problem, trouble sleeping)     Trouble sleeping 5. NEW MEDICINES:  Have you started on any new medicines recently? (e.g., opioid pain medicines, benzodiazepines, muscle relaxants, antidepressants, antihistamines, neuroleptics, beta blockers)     no 6. OTHER SYMPTOMS: Do you have any other symptoms? (e.g., chest pain, fever, cough, SOB, vomiting, diarrhea, bleeding, other areas of pain)     no 7. PREGNANCY: Is there any chance you are pregnant? When was your last menstrual period?     Na  Pt would like to check on A1c and anemia levels.  Protocols used: Weakness (Generalized) and Fatigue-A-AH

## 2024-07-11 NOTE — Telephone Encounter (Signed)
 Scheduled with [facility] on 07/12/2024.

## 2024-07-12 ENCOUNTER — Other Ambulatory Visit: Payer: Self-pay

## 2024-07-12 ENCOUNTER — Telehealth (HOSPITAL_COMMUNITY): Payer: Self-pay | Admitting: Psychiatry

## 2024-07-12 ENCOUNTER — Encounter (HOSPITAL_COMMUNITY): Payer: Self-pay

## 2024-07-12 ENCOUNTER — Encounter (HOSPITAL_COMMUNITY): Admitting: Psychiatry

## 2024-07-12 ENCOUNTER — Ambulatory Visit: Payer: Self-pay | Admitting: Nurse Practitioner

## 2024-07-12 MED ORDER — SERTRALINE HCL 50 MG PO TABS
50.0000 mg | ORAL_TABLET | Freq: Every day | ORAL | 1 refills | Status: AC
  Filled 2024-07-12: qty 30, 30d supply, fill #0

## 2024-07-12 NOTE — Telephone Encounter (Signed)
 Patient did not connect for virtual psychiatric medication management appointment on 07/12/24 at 3PM. Sent secure video link with no response. Called phone and patient answered reporting inability to attend appointment. Reviewed no-show policy and that she will be rescheduled for 1 additional appointment before she is required to present as walk-in. She expressed understanding. Patient was made aware of this provider's departure from [facility] at the end of Nov 2025 and that she will be transitioned to alternative provider in the clinic. Will send in refill of Zoloft  to bridge until next appointment. Front desk to call patient to reschedule with next provider.  [name], MD 07/12/24

## 2024-07-20 ENCOUNTER — Ambulatory Visit: Payer: Self-pay | Admitting: Nurse Practitioner

## 2024-07-23 ENCOUNTER — Other Ambulatory Visit: Payer: Self-pay

## 2024-08-10 DIAGNOSIS — Z419 Encounter for procedure for purposes other than remedying health state, unspecified: Secondary | ICD-10-CM | POA: Diagnosis not present
